# Patient Record
Sex: Female | Born: 1985 | Race: Black or African American | Hispanic: No | Marital: Single | Smoking: Current every day smoker
Health system: Southern US, Community
[De-identification: ages and names within clinical notes are randomized; demographics above are authoritative.]

## PROBLEM LIST (undated history)

## (undated) ENCOUNTER — Inpatient Hospital Stay (HOSPITAL_COMMUNITY): Payer: Self-pay

## (undated) DIAGNOSIS — J45909 Unspecified asthma, uncomplicated: Secondary | ICD-10-CM

## (undated) DIAGNOSIS — D649 Anemia, unspecified: Secondary | ICD-10-CM

## (undated) DIAGNOSIS — O24419 Gestational diabetes mellitus in pregnancy, unspecified control: Secondary | ICD-10-CM

## (undated) HISTORY — PX: DILATION AND CURETTAGE OF UTERUS: SHX78

## (undated) HISTORY — DX: Gestational diabetes mellitus in pregnancy, unspecified control: O24.419

---
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
## 2006-03-16 ENCOUNTER — Emergency Department (HOSPITAL_COMMUNITY): Admission: EM | Admit: 2006-03-16 | Discharge: 2006-03-16 | Payer: Self-pay | Admitting: Emergency Medicine

## 2006-05-26 ENCOUNTER — Emergency Department (HOSPITAL_COMMUNITY): Admission: EM | Admit: 2006-05-26 | Discharge: 2006-05-26 | Payer: Self-pay | Admitting: Family Medicine

## 2006-05-27 ENCOUNTER — Emergency Department (HOSPITAL_COMMUNITY): Admission: EM | Admit: 2006-05-27 | Discharge: 2006-05-27 | Payer: Self-pay | Admitting: Emergency Medicine

## 2007-09-10 ENCOUNTER — Emergency Department (HOSPITAL_COMMUNITY): Admission: EM | Admit: 2007-09-10 | Discharge: 2007-09-10 | Payer: Self-pay | Admitting: Emergency Medicine

## 2008-04-26 ENCOUNTER — Ambulatory Visit: Payer: Self-pay | Admitting: Physician Assistant

## 2008-04-26 ENCOUNTER — Inpatient Hospital Stay (HOSPITAL_COMMUNITY): Admission: AD | Admit: 2008-04-26 | Discharge: 2008-04-26 | Payer: Self-pay | Admitting: Obstetrics

## 2008-07-13 ENCOUNTER — Inpatient Hospital Stay (HOSPITAL_COMMUNITY): Admission: AD | Admit: 2008-07-13 | Discharge: 2008-07-16 | Payer: Self-pay | Admitting: Family Medicine

## 2009-06-17 ENCOUNTER — Emergency Department (HOSPITAL_COMMUNITY): Admission: EM | Admit: 2009-06-17 | Discharge: 2009-06-18 | Payer: Self-pay | Admitting: Emergency Medicine

## 2009-06-18 ENCOUNTER — Inpatient Hospital Stay (HOSPITAL_COMMUNITY): Admission: EM | Admit: 2009-06-18 | Discharge: 2009-06-19 | Payer: Self-pay | Admitting: Psychiatry

## 2009-06-18 ENCOUNTER — Ambulatory Visit: Payer: Self-pay | Admitting: Psychiatry

## 2009-12-13 ENCOUNTER — Emergency Department (HOSPITAL_COMMUNITY): Admission: EM | Admit: 2009-12-13 | Discharge: 2009-12-14 | Payer: Self-pay | Admitting: Emergency Medicine

## 2010-04-01 LAB — COMPREHENSIVE METABOLIC PANEL
ALT: 12 U/L (ref 0–35)
AST: 15 U/L (ref 0–37)
Alkaline Phosphatase: 77 U/L (ref 39–117)
CO2: 21 mEq/L (ref 19–32)
Chloride: 109 mEq/L (ref 96–112)
Creatinine, Ser: 0.79 mg/dL (ref 0.4–1.2)
GFR calc Af Amer: >60 mL/min (ref >60)
GFR calc non Af Amer: >60 mL/min (ref >60)
Potassium: 3.4 mEq/L — ABNORMAL LOW (ref 3.5–5.1)
Sodium: 139 mEq/L (ref 135–145)
Total Bilirubin: 0.6 mg/dL (ref 0.3–1.2)

## 2010-04-01 LAB — URINALYSIS, ROUTINE W REFLEX MICROSCOPIC
Bilirubin Urine: NEGATIVE
Glucose, UA: NEGATIVE mg/dL
Hgb urine dipstick: NEGATIVE
Ketones, ur: NEGATIVE mg/dL
Nitrite: NEGATIVE
Protein, ur: NEGATIVE mg/dL
Specific Gravity, Urine: 1.007 (ref 1.005–1.030)
Urobilinogen, UA: 0.2 mg/dL (ref 0.0–1.0)
pH: 6 (ref 5.0–8.0)

## 2010-04-01 LAB — CBC
HCT: 35.3 % — ABNORMAL LOW (ref 36.0–46.0)
Hemoglobin: 12.1 g/dL (ref 12.0–15.0)
MCH: 30.3 pg (ref 26.0–34.0)
MCHC: 34.3 g/dL (ref 30.0–36.0)
MCV: 88.6 fL (ref 78.0–100.0)
Platelets: 262 10*3/uL (ref 150–400)
RBC: 3.98 MIL/uL (ref 3.87–5.11)
RDW: 13.4 % (ref 11.5–15.5)
WBC: 8 10*3/uL (ref 4.0–10.5)

## 2010-04-01 LAB — LIPASE, BLOOD: Lipase: 19 U/L (ref 11–59)

## 2010-04-01 LAB — DIFFERENTIAL
Basophils Absolute: 0 10*3/uL (ref 0.0–0.1)
Eosinophils Absolute: 0.1 10*3/uL (ref 0.0–0.7)
Eosinophils Relative: 1 % (ref 0–5)

## 2010-04-01 LAB — POCT PREGNANCY, URINE: Preg Test, Ur: NEGATIVE

## 2010-04-07 LAB — DIFFERENTIAL
Basophils Absolute: 0 10*3/uL (ref 0.0–0.1)
Basophils Relative: 0 % (ref 0–1)
Basophils Relative: 0 % (ref 0–1)
Eosinophils Absolute: 0.1 10*3/uL (ref 0.0–0.7)
Lymphocytes Relative: 17 % (ref 12–46)
Lymphs Abs: 1.4 10*3/uL (ref 0.7–4.0)
Monocytes Absolute: 0.1 10*3/uL (ref 0.1–1.0)
Monocytes Absolute: 0.5 10*3/uL (ref 0.1–1.0)
Monocytes Relative: 1 % — ABNORMAL LOW (ref 3–12)
Monocytes Relative: 5 % (ref 3–12)
Neutro Abs: 5.7 10*3/uL (ref 1.7–7.7)
Neutro Abs: 6.6 10*3/uL (ref 1.7–7.7)
Neutrophils Relative %: 81 % — ABNORMAL HIGH (ref 43–77)

## 2010-04-07 LAB — COMPREHENSIVE METABOLIC PANEL
Albumin: 4.3 g/dL (ref 3.5–5.2)
Alkaline Phosphatase: 109 U/L (ref 39–117)
BUN: 7 mg/dL (ref 6–23)
Calcium: 9.6 mg/dL (ref 8.4–10.5)
Creatinine, Ser: 0.77 mg/dL (ref 0.4–1.2)
Glucose, Bld: 76 mg/dL (ref 70–99)
Potassium: 3.3 mEq/L — ABNORMAL LOW (ref 3.5–5.1)
Total Protein: 7.7 g/dL (ref 6.0–8.3)

## 2010-04-07 LAB — RAPID URINE DRUG SCREEN, HOSP PERFORMED
Amphetamines: NOT DETECTED
Barbiturates: NOT DETECTED
Benzodiazepines: NOT DETECTED
Opiates: NOT DETECTED

## 2010-04-07 LAB — CBC
HCT: 39.9 % (ref 36.0–46.0)
Hemoglobin: 12.3 g/dL (ref 12.0–15.0)
Hemoglobin: 13.1 g/dL (ref 12.0–15.0)
MCHC: 32.9 g/dL (ref 30.0–36.0)
MCHC: 34.1 g/dL (ref 30.0–36.0)
MCV: 88.9 fL (ref 78.0–100.0)
MCV: 90.5 fL (ref 78.0–100.0)
Platelets: 228 10*3/uL (ref 150–400)
RDW: 13.5 % (ref 11.5–15.5)
RDW: 13.9 % (ref 11.5–15.5)

## 2010-04-07 LAB — TSH: TSH: 1.83 u[IU]/mL (ref 0.350–4.500)

## 2010-04-07 LAB — URINE MICROSCOPIC-ADD ON

## 2010-04-07 LAB — URINALYSIS, ROUTINE W REFLEX MICROSCOPIC
Glucose, UA: NEGATIVE mg/dL
Hgb urine dipstick: NEGATIVE
Ketones, ur: NEGATIVE mg/dL
Protein, ur: NEGATIVE mg/dL
pH: 6.5 (ref 5.0–8.0)

## 2010-04-07 LAB — HIV ANTIBODY (ROUTINE TESTING W REFLEX): HIV: NONREACTIVE

## 2010-04-07 LAB — RPR: RPR Ser Ql: NONREACTIVE

## 2010-04-07 LAB — ETHANOL: Alcohol, Ethyl (B): <5 mg/dL (ref 0–10)

## 2010-04-28 LAB — CBC
HCT: 25 % — ABNORMAL LOW (ref 36.0–46.0)
Hemoglobin: 11.3 g/dL — ABNORMAL LOW (ref 12.0–15.0)
MCHC: 34.4 g/dL (ref 30.0–36.0)
MCV: 90.9 fL (ref 78.0–100.0)
Platelets: 204 10*3/uL (ref 150–400)
RBC: 2.75 MIL/uL — ABNORMAL LOW (ref 3.87–5.11)
RBC: 3.67 MIL/uL — ABNORMAL LOW (ref 3.87–5.11)
RDW: 14.1 % (ref 11.5–15.5)
WBC: 18.3 10*3/uL — ABNORMAL HIGH (ref 4.0–10.5)

## 2010-04-28 LAB — RPR: RPR Ser Ql: NONREACTIVE

## 2010-04-30 LAB — WET PREP, GENITAL: Trich, Wet Prep: NONE SEEN

## 2010-04-30 LAB — URINALYSIS, ROUTINE W REFLEX MICROSCOPIC
Glucose, UA: NEGATIVE mg/dL
Nitrite: NEGATIVE
Specific Gravity, Urine: <1.005 — ABNORMAL LOW (ref 1.005–1.030)
pH: 7 (ref 5.0–8.0)

## 2010-06-26 ENCOUNTER — Emergency Department (HOSPITAL_COMMUNITY)
Admission: EM | Admit: 2010-06-26 | Discharge: 2010-06-26 | Disposition: A | Discharge status: Home / Self Care | Payer: Self-pay | Attending: Emergency Medicine | Admitting: Emergency Medicine

## 2010-06-26 DIAGNOSIS — K089 Disorder of teeth and supporting structures, unspecified: Secondary | ICD-10-CM | POA: Insufficient documentation

## 2010-07-25 ENCOUNTER — Emergency Department (HOSPITAL_COMMUNITY)
Admission: EM | Admit: 2010-07-25 | Discharge: 2010-07-26 | Disposition: A | Discharge status: Home / Self Care | Payer: Self-pay | Attending: Emergency Medicine | Admitting: Emergency Medicine

## 2010-07-25 DIAGNOSIS — B9689 Other specified bacterial agents as the cause of diseases classified elsewhere: Secondary | ICD-10-CM | POA: Insufficient documentation

## 2010-07-25 DIAGNOSIS — N898 Other specified noninflammatory disorders of vagina: Secondary | ICD-10-CM | POA: Insufficient documentation

## 2010-07-25 DIAGNOSIS — A499 Bacterial infection, unspecified: Secondary | ICD-10-CM | POA: Insufficient documentation

## 2010-07-25 DIAGNOSIS — N760 Acute vaginitis: Secondary | ICD-10-CM | POA: Insufficient documentation

## 2010-07-26 LAB — WET PREP, GENITAL
Trich, Wet Prep: NONE SEEN
Yeast Wet Prep HPF POC: NONE SEEN

## 2010-07-26 LAB — URINALYSIS, ROUTINE W REFLEX MICROSCOPIC
Bilirubin Urine: NEGATIVE
Ketones, ur: NEGATIVE mg/dL
Nitrite: NEGATIVE
Protein, ur: NEGATIVE mg/dL
Urobilinogen, UA: 1 mg/dL (ref 0.0–1.0)

## 2010-07-26 LAB — DIFFERENTIAL
Basophils Relative: 0 % (ref 0–1)
Eosinophils Absolute: 0.2 10*3/uL (ref 0.0–0.7)
Eosinophils Relative: 2 % (ref 0–5)
Lymphs Abs: 2.1 10*3/uL (ref 0.7–4.0)
Monocytes Relative: 6 % (ref 3–12)
Neutrophils Relative %: 58 % (ref 43–77)

## 2010-07-26 LAB — POCT I-STAT, CHEM 8
BUN: 4 mg/dL — ABNORMAL LOW (ref 6–23)
Calcium, Ion: 1.18 mmol/L (ref 1.12–1.32)
Chloride: 109 mEq/L (ref 96–112)
Glucose, Bld: 99 mg/dL (ref 70–99)
HCT: 34 % — ABNORMAL LOW (ref 36.0–46.0)
TCO2: 22 mmol/L (ref 0–100)

## 2010-07-26 LAB — CBC
MCH: 29.8 pg (ref 26.0–34.0)
MCV: 87.7 fL (ref 78.0–100.0)
Platelets: 213 10*3/uL (ref 150–400)
RBC: 3.73 MIL/uL — ABNORMAL LOW (ref 3.87–5.11)
RDW: 13.5 % (ref 11.5–15.5)

## 2010-07-29 LAB — GC/CHLAMYDIA PROBE AMP, GENITAL
Chlamydia, DNA Probe: POSITIVE — AB
GC Probe Amp, Genital: NEGATIVE

## 2013-07-14 ENCOUNTER — Encounter (HOSPITAL_COMMUNITY): Payer: Self-pay | Admitting: Emergency Medicine

## 2013-07-14 ENCOUNTER — Emergency Department (HOSPITAL_COMMUNITY)
Admission: EM | Admit: 2013-07-14 | Discharge: 2013-07-14 | Disposition: A | Discharge status: Home / Self Care | Payer: Self-pay | Attending: Emergency Medicine | Admitting: Emergency Medicine

## 2013-07-14 DIAGNOSIS — N751 Abscess of Bartholin's gland: Secondary | ICD-10-CM | POA: Insufficient documentation

## 2013-07-14 DIAGNOSIS — Z79899 Other long term (current) drug therapy: Secondary | ICD-10-CM | POA: Insufficient documentation

## 2013-07-14 DIAGNOSIS — A499 Bacterial infection, unspecified: Secondary | ICD-10-CM | POA: Insufficient documentation

## 2013-07-14 DIAGNOSIS — B9689 Other specified bacterial agents as the cause of diseases classified elsewhere: Secondary | ICD-10-CM | POA: Insufficient documentation

## 2013-07-14 DIAGNOSIS — Z792 Long term (current) use of antibiotics: Secondary | ICD-10-CM | POA: Insufficient documentation

## 2013-07-14 DIAGNOSIS — Z3202 Encounter for pregnancy test, result negative: Secondary | ICD-10-CM | POA: Insufficient documentation

## 2013-07-14 DIAGNOSIS — F17200 Nicotine dependence, unspecified, uncomplicated: Secondary | ICD-10-CM | POA: Insufficient documentation

## 2013-07-14 DIAGNOSIS — N760 Acute vaginitis: Secondary | ICD-10-CM | POA: Insufficient documentation

## 2013-07-14 LAB — POC URINE PREG, ED: PREG TEST UR: NEGATIVE

## 2013-07-14 LAB — WET PREP, GENITAL
Trich, Wet Prep: NONE SEEN
Yeast Wet Prep HPF POC: NONE SEEN

## 2013-07-14 LAB — RPR

## 2013-07-14 LAB — HIV ANTIBODY (ROUTINE TESTING W REFLEX): HIV: NONREACTIVE

## 2013-07-14 MED ORDER — METRONIDAZOLE 500 MG PO TABS: 500.0000 mg | ORAL_TABLET | Freq: Two times a day (BID) | ORAL | Status: DC

## 2013-07-14 MED ORDER — HYDROCODONE-ACETAMINOPHEN 5-325 MG PO TABS: 2.0000 | ORAL_TABLET | ORAL | Status: DC | PRN

## 2013-07-14 NOTE — Discharge Instructions (Signed)
Keep wound clean and dry. Apply warm compresses to affected area throughout the day, and perform Sitz baths 3 times per day. Take Norco as directed, as needed for pain but do not drive or operate machinery with pain medication use. Followup with [facility] in 2-3 days for wound recheck and further evaluation.  [facility] for emergent changing or worsening symptoms. You also have bacterial vaginosis. Take all of your antibiotic for this, and do not drink alcohol while taking this medication.   Bartholin's Cyst or Abscess Bartholin's glands are small glands located within the folds of skin (labia) along the sides of the lower opening of the vagina (birth canal). A cyst may develop when the duct of the gland becomes blocked. When this happens, fluid that accumulates within the cyst can become infected. This is known as an abscess. The Bartholin gland produces a mucous fluid to lubricate the outside of the vagina during sexual intercourse. SYMPTOMS   Patients with a small cyst may not have any symptoms.  Mild discomfort to severe pain depending on the size of the cyst and if it is infected (abscess).  Pain, redness, and swelling around the lower opening of the vagina.  Painful intercourse.  Pressure in the perineal area.  Swelling of the lips of the vagina (labia).  The cyst or abscess can be on one side or both sides of the vagina. DIAGNOSIS   A large swelling is seen in the lower vagina area by your caregiver.  Painful to touch.  Redness and pain, if it is an abscess. TREATMENT   Sometimes the cyst will go away on its own.  Apply warm wet compresses to the area or take hot sitz baths several times a day.  An incision to drain the cyst or abscess with local anesthesia.  Culture the pus, if it is an abscess.  Antibiotic treatment, if it is an abscess.  Cut open the gland and suture the edges to make the opening of the gland bigger  (marsupialization).  Remove the whole gland if the cyst or abscess returns. PREVENTION   Practice good hygiene.  Clean the vaginal area with a mild soap and soft cloth when bathing.  Do not rub hard in the vaginal area when bathing.  Protect the crotch area with a padded cushion if you take long bike rides or ride horses.  Be sure you are well lubricated when you have sexual intercourse. HOME CARE INSTRUCTIONS   If your cyst or abscess was opened, a small piece of gauze, or a drain, may have been placed in the wound to allow drainage. Do not remove this gauze or drain unless directed by your caregiver.  Wear feminine pads, not tampons, as needed for any drainage or bleeding.  If antibiotics were prescribed, take them exactly as directed. Finish the entire course.  Only take over-the-counter or prescription medicines for pain, discomfort, or fever as directed by your caregiver. SEEK IMMEDIATE MEDICAL CARE IF:   You have an increase in pain, redness, swelling, or drainage.  You have bleeding from the wound which results in the use of more than the number of pads suggested by your caregiver in 24 hours.  You have chills.  You have a fever.  You develop any new problems (symptoms) or aggravation of your existing condition. MAKE SURE YOU:   Understand these instructions.  Will watch your condition.  Will get help right away if you are not doing well or get  worse. Document Released: 01/05/2005 Document Revised: 03/30/2011 Document Reviewed: 08/24/2007 [facility] Patient Information 2015 [place], [place]. This information is not intended to replace advice given to you by your health care provider. Make sure you discuss any questions you have with your health care provider.  Bacterial Vaginosis Bacterial vaginosis is a vaginal infection that occurs when the normal balance of bacteria in the vagina is disrupted. It results from an overgrowth of certain bacteria. This is the most  common vaginal infection in women of childbearing age. Treatment is important to prevent complications, especially in pregnant women, as it can cause a premature delivery. CAUSES  Bacterial vaginosis is caused by an increase in harmful bacteria that are normally present in smaller amounts in the vagina. Several different kinds of bacteria can cause bacterial vaginosis. However, the reason that the condition develops is not fully understood. RISK FACTORS Certain activities or behaviors can put you at an increased risk of developing bacterial vaginosis, including:  Having a new sex partner or multiple sex partners.  Douching.  Using an intrauterine device (IUD) for contraception. Women do not get bacterial vaginosis from toilet seats, bedding, swimming pools, or contact with objects around them. SIGNS AND SYMPTOMS  Some women with bacterial vaginosis have no signs or symptoms. Common symptoms include:  Grey vaginal discharge.  A fishlike odor with discharge, especially after sexual intercourse.  Itching or burning of the vagina and vulva.  Burning or pain with urination. DIAGNOSIS  Your health care provider will take a medical history and examine the vagina for signs of bacterial vaginosis. A sample of vaginal fluid may be taken. Your health care provider will look at this sample under a microscope to check for bacteria and abnormal cells. A vaginal pH test may also be done.  TREATMENT  Bacterial vaginosis may be treated with antibiotic medicines. These may be given in the form of a pill or a vaginal cream. A second round of antibiotics may be prescribed if the condition comes back after treatment.  HOME CARE INSTRUCTIONS   Only take over-the-counter or prescription medicines as directed by your health care provider.  If antibiotic medicine was prescribed, take it as directed. Make sure you finish it even if you start to feel better.  Do not have sex until treatment is  completed.  Tell all sexual partners that you have a vaginal infection. They should see their health care provider and be treated if they have problems, such as a mild rash or itching.  Practice safe sex by using condoms and only having one sex partner. SEEK MEDICAL CARE IF:   Your symptoms are not improving after 3 days of treatment.  You have increased discharge or pain.  You have a fever. MAKE SURE YOU:   Understand these instructions.  Will watch your condition.  Will get help right away if you are not doing well or get worse. FOR MORE INFORMATION  Centers for Disease Control and Prevention, Division of STD Prevention: SolutionApps.co.zawww.cdc.gov/std American Sexual Health Association (ASHA): www.ashastd.org  Document Released: 01/05/2005 Document Revised: 10/26/2012 Document Reviewed: 08/17/2012 [facility] Patient Information 2015 [place], [place]. This information is not intended to replace advice given to you by your health care provider. Make sure you discuss any questions you have with your health care provider.

## 2013-07-14 NOTE — ED Notes (Signed)
Phlebotomy at bedside.

## 2013-07-14 NOTE — ED Notes (Signed)
Reports having pain and swelling to vaginal area. No acute distress noted at triage.

## 2013-07-14 NOTE — ED Provider Notes (Signed)
CSN: [id number]     Arrival date & time 07/14/13  1147 History   First MD Initiated Contact with Patient 07/14/13 1327     Chief Complaint  Patient presents with  . Abscess     (Consider location/radiation/quality/duration/timing/severity/associated sxs/prior Treatment) HPI Comments: Gail Garcia is a 28 y.o. Female with no significant PMHx presents complaining of vaginal/L labial swelling and pain that began this morning at 4am. She states she's unsure if the swelling is internal or external. She wiped this morning and noticed some creamy discharge, unsure if it was coming from her vagina or from the swollen area. Pain is 6/10, sharp, non-radiating, along the L labia, worsened with sitting/pressure on that side, and has not tried anything to relieve the pain. Denies vaginal bleeding, pelvic or abdominal pain, rashes, N/V/D/C, HA, CP, SOB, fevers/chills, myalgias or arthralgias. States that she's had pain in this location for the last few days but had not noticed swelling or d/c until this morning. Currently sexually active with one partner, no protection used. Of note, pt reports that her menses is usually very regular, but that she had two cycles in May, one in the beginning (3rd-9th) and then again on May 29-June 4th. Denies current pregnancy. Smokes 1pack every 3 days, and marijuana occasionally. Drinks infrequently. Denies illicit drug use. No prior abscesses or STDs.   Patient is a 28 y.o. female presenting with abscess. The history is provided by the patient. No language interpreter was used.  Abscess Location:  Ano-genital Ano-genital abscess location:  Vagina Size:  Unsure Abscess quality: draining (unsure if discharge is vaginal or from the swollen area) and painful   Red streaking: no   Duration:  10 hours Progression:  Unchanged Pain details:    Quality:  Sharp   Severity:  Moderate   Duration:  10 hours   Timing:  Constant   Progression:  Unchanged Chronicity:  New Context:  not diabetes, not immunosuppression and not injected drug use   Relieved by:  None tried Exacerbated by: pressure. Ineffective treatments:  None tried Associated symptoms: no anorexia, no fatigue, no fever, no nausea and no vomiting     History reviewed. No pertinent past medical history. History reviewed. No pertinent past surgical history. History reviewed. No pertinent family history. History  Substance Use Topics  . Smoking status: Current Every Day Smoker    Types: Cigarettes  . Smokeless tobacco: Not on file  . Alcohol Use: Yes     Comment: occ   OB History   Grav Para Term Preterm Abortions TAB SAB Ect Mult Living                 Review of Systems  Constitutional: Negative for fever, chills, appetite change and fatigue.  Eyes: Negative for pain and redness.  Respiratory: Negative for cough, chest tightness and shortness of breath.   Gastrointestinal: Negative for nausea, vomiting, abdominal pain, diarrhea, constipation, blood in stool and anorexia.  Genitourinary: Positive for vaginal discharge and genital sores. Negative for dysuria, urgency, frequency, hematuria, flank pain, decreased urine volume, vaginal bleeding, difficulty urinating, vaginal pain, pelvic pain and dyspareunia.  Musculoskeletal: Negative for arthralgias, back pain, joint swelling and myalgias.  Skin: Negative for rash.  Neurological: Negative for dizziness and weakness.  10 Systems reviewed and are negative for acute change except as noted in the HPI.     Allergies  Review of patient's allergies indicates no known allergies.  Home Medications   Prior to Admission medications  Medication Sig Start Date End Date Taking? Authorizing Hezekiah Veltre  Prenatal Vit-Fe Fumarate-FA (PRENATAL MULTIVITAMIN) TABS tablet Take 1 tablet by mouth daily at 12 noon.   Yes Historical Mickel Schreur, MD  HYDROcodone-acetaminophen (NORCO) 5-325 MG per tablet Take 2 tablets by mouth every 4 (four) hours as needed. 07/14/13    Mercedes Strupp Camprubi-Soms, PA-C  metroNIDAZOLE (FLAGYL) 500 MG tablet Take 1 tablet (500 mg total) by mouth 2 (two) times daily. DO NOT DRINK ALCOHOL WHILE TAKING THIS MEDICATION 07/14/13   Donnita FallsMercedes Strupp Camprubi-Soms, PA-C   BP 124/74  Pulse 90  Temp(Src) 98.2 F (36.8 C) (Oral)  Resp 20  Ht 5\' 7"  (1.702 m)  Wt 164 lb 9.6 oz (74.662 kg)  BMI 25.77 kg/m2  SpO2 100%  LMP 06/16/2013 Physical Exam  Nursing note and vitals reviewed. Constitutional: She is oriented to person, place, and time. Vital signs are normal. She appears well-developed and well-nourished. No distress.  HENT:  Head: Normocephalic and atraumatic.  Mouth/Throat: Mucous membranes are normal.  Eyes: Conjunctivae and EOM are normal. Right eye exhibits no discharge. Left eye exhibits no discharge.  Neck: Normal range of motion. Neck supple.  Cardiovascular: Normal rate, regular rhythm, normal heart sounds and intact distal pulses.   No murmur heard. Pulmonary/Chest: Effort normal and breath sounds normal. She has no wheezes. She has no rales.  Abdominal: Soft. Normal appearance and bowel sounds are normal. She exhibits no distension. There is no tenderness. There is no rigidity, no rebound, no guarding, no CVA tenderness and no tenderness at McBurney's point.  Genitourinary: Uterus normal. Pelvic exam was performed with patient supine. There is no rash, tenderness, lesion or injury on the right labia. There is tenderness on the left labia. There is no rash, lesion or injury on the left labia. Cervix exhibits discharge. Right adnexum displays no mass, no tenderness and no fullness. Left adnexum displays no mass, no tenderness and no fullness. No erythema around the vagina. Vaginal discharge found.  Moderate TTP to labia minora at 4'o'clock position with swelling noted, approx 2cm in diameter area of induration, spherical, with no fluctuance or erythema noted. No discharge from the area. No chancres or chancroids noted. No  rashes or vesicles. No adnexal masses, TTP, or fullness. No CMT or cervical friability. White chunky discharge noted within vaginal vault. Uterus mobile, non-TTP, non-deviated, not enlarged.  Musculoskeletal: Normal range of motion.  Neurological: She is alert and oriented to person, place, and time.  Skin: Skin is warm, dry and intact. No lesion noted. No erythema.  No rashes or lesions noted to labia externally. See above for description of indurated area.  Psychiatric: She has a normal mood and affect.    ED Course  Procedures (including critical care time) Labs Review Labs Reviewed  WET PREP, GENITAL - Abnormal; Notable for the following:    Clue Cells Wet Prep HPF POC FEW (*)    WBC, Wet Prep HPF POC FEW (*)    All other components within normal limits  GC/CHLAMYDIA PROBE AMP  RPR  HIV ANTIBODY (ROUTINE TESTING)  POC URINE PREG, ED    Imaging Review No results found.   EKG Interpretation None      MDM   Final diagnoses:  Bartholin's gland abscess  BV (bacterial vaginosis)    Gail Garcia is a 28 y.o. female who presents complaining of vaginal/labial swelling and discharge. No urinary symptoms, no abd pain. Will obtain wet prep, STD panel and Upreg given her uncertainty of STD exposure  or pregnancy. Will perform pelvic exam. DDx includes bartholin's gland abscess/cyst, yeast/BV, STD, chancroid, or superficial labial abscess. I do not believe pt needs U/A given no urinary symptoms or abd pain.  2:34 PM Pelvic demonstrates spherical area of induration approx 2cm in diameter at the area of the bartholin's gland but extending deeper, likely forming abscess. No active drainage, no fluctuance noted. Vaginal discharge noted, Clue cells on wet prep, will rx flagyl. Dr. Denton Lank performed I&D of superficial labia with minimal drainage, probing unable to access deeper abscess pocket. Plan to d/c with f/up with women's outpt clinic for further tx of likely bartholin's gland  cyst/abscess. Warm sitz baths and warm compresses explained to pt. Advised if any changes or worsening occurs, to go to [facility] hospital. I explained the diagnosis and have given explicit precautions to return to the [facility] ER including for any other new or worsening symptoms. The patient understands and accepts the medical plan as it's been dictated and I have answered their questions. Discharge instructions concerning home care and prescriptions have been given. The patient is STABLE and is discharged to home in good condition.   BP 124/74  Pulse 90  Temp(Src) 98.2 F (36.8 C) (Oral)  Resp 20  Ht 5\' 7"  (1.702 m)  Wt 164 lb 9.6 oz (74.662 kg)  BMI 25.77 kg/m2  SpO2 100%  LMP 06/16/2013   Donnita Falls Camprubi-Soms, PA-C 07/14/13 [address] Jud, PA-C 07/14/13 1521

## 2013-07-15 LAB — GC/CHLAMYDIA PROBE AMP
CT PROBE, AMP APTIMA: NEGATIVE
GC Probe RNA: NEGATIVE

## 2013-07-17 ENCOUNTER — Inpatient Hospital Stay (HOSPITAL_COMMUNITY)
Admission: AD | Admit: 2013-07-17 | Discharge: 2013-07-17 | Disposition: A | Discharge status: Home / Self Care | Payer: Self-pay | Source: Ambulatory Visit | Attending: Obstetrics & Gynecology | Admitting: Obstetrics & Gynecology

## 2013-07-17 ENCOUNTER — Encounter (HOSPITAL_COMMUNITY): Payer: Self-pay | Admitting: *Deleted

## 2013-07-17 DIAGNOSIS — N751 Abscess of Bartholin's gland: Secondary | ICD-10-CM

## 2013-07-17 DIAGNOSIS — F17200 Nicotine dependence, unspecified, uncomplicated: Secondary | ICD-10-CM | POA: Insufficient documentation

## 2013-07-17 HISTORY — DX: Unspecified asthma, uncomplicated: J45.909

## 2013-07-17 MED ORDER — SULFAMETHOXAZOLE-TRIMETHOPRIM 800-160 MG PO TABS: 1.0000 | ORAL_TABLET | Freq: Two times a day (BID) | ORAL | Status: AC

## 2013-07-17 NOTE — MAU Note (Signed)
[facility] for a Bartholin cyst and had it I & D but nothing came out. Was instructed to come to MAU for drainage. Patient states the cyst is the same size and painful. Some mucus discharge.

## 2013-07-17 NOTE — ED Provider Notes (Signed)
Medical screening examination/treatment/procedure(s) were conducted as a shared visit with non-physician practitioner(s) and myself.  I personally evaluated the patient during the encounter.  Pt c/o left lower labial swelling/pain. On exam, suspect early Bartholins abscess. I and D'd - residual induration. Pt afeb. No cellulitis.   INCISION AND DRAINAGE Performed by: Suzi RootsSTEINL,KEVIN E Consent: Verbal consent obtained. Risks and benefits: risks, benefits and alternatives were discussed Type: abscess  Body area: left bartholins cyst/abscess  Anesthesia: local infiltration  Incision was made with a scalpel.  Local anesthetic: lidocaine 2% w epinephrine  Anesthetic total: 3 ml  Drainage: purulent  Drainage amount: small  No cavity/area felt amenable to drain or packing   Patient tolerance: Patient tolerated the procedure well with no immediate complications.      Suzi RootsKevin E Steinl, MD 07/17/13 [phone]

## 2013-07-17 NOTE — MAU Provider Note (Signed)
  History     CSN: [id number]  Arrival date and time: 07/17/13 1707   First Gail Garcia Initiated Contact with Patient 07/17/13 1758      Chief Complaint  Patient presents with  . Bartholin's Cyst   HPI Ms. Gail Garcia is a 28 y.o. [phone] who presents to MAU today with complaint of a bartholin's gland abscess. The patient [facility] and [facility]. The patient denies drainage since that visit. She states that it is about the same size and pain is similar to them. She denies any heat or erythema of the area. She denies fever or vaginal discharge today.   OB History   Grav Para Term Preterm Abortions TAB SAB Ect Mult Living   3 1 1  2  2   1       Past Medical History  Diagnosis Date  . Asthma     Past Surgical History  Procedure Laterality Date  . No past surgeries      History reviewed. No pertinent family history.  History  Substance Use Topics  . Smoking status: Current Every Day Smoker    Types: Cigarettes  . Smokeless tobacco: Not on file  . Alcohol Use: Yes     Comment: occ    Allergies: No Known Allergies  No prescriptions prior to admission    Review of Systems  Constitutional: Negative for fever and malaise/fatigue.  Genitourinary: Negative for dysuria, urgency and frequency.       Neg - vaginal discharge   Physical Exam   Blood pressure 122/92, pulse 76, temperature 98.9 F (37.2 C), temperature source Oral, resp. rate 18, height 5\' 7"  (1.702 m), weight 161 lb 12.8 oz (73.392 kg), last menstrual period 06/16/2013, SpO2 100.00%.  Physical Exam  Constitutional: She is oriented to person, place, and time. She appears well-developed and well-nourished. No distress.  HENT:  Head: Normocephalic and atraumatic.  Cardiovascular: Normal rate.   Respiratory: Effort normal.  Genitourinary:     Neurological: She is alert and oriented to person, place, and time.  Skin: Skin is warm and dry. No erythema.    Psychiatric: She has a normal mood and affect.    MAU Course  Procedures None  MDM The area is not appropriate for I&D today.  Patient was given Rx for Flagyl on 07/14/13. The pharmacy filled it for Methocarbamol instead of metronidazole.  I have called and made the pharmacy aware. Patient was instructed to discontinue Robaxin.  Rx for bactrim given.   Assessment and Plan  A: Bartholin's gland abscess  P: Discharge home Rx for Bactrim given to patient Patient advised to discontinue Robaxin Patient advised to continue warm compresses and sitz baths to encourage spontaneous drainage Warning signs for infection or need for I&D were discussed Patient may return to MAU as needed or if her condition were to change or worsen   Freddi StarrJulie N Ethier, PA-C  07/17/2013, 7:12 PM

## 2013-07-17 NOTE — Discharge Instructions (Signed)
Bartholin's Cyst or Abscess °Bartholin's glands are small glands located within the folds of skin (labia) along the sides of the lower opening of the vagina (birth canal). A cyst may develop when the duct of the gland becomes blocked. When this happens, fluid that accumulates within the cyst can become infected. This is known as an abscess. The Bartholin gland produces a mucous fluid to lubricate the outside of the vagina during sexual intercourse. °SYMPTOMS  °· Patients with a small cyst may not have any symptoms. °· Mild discomfort to severe pain depending on the size of the cyst and if it is infected (abscess). °· Pain, redness, and swelling around the lower opening of the vagina. °· Painful intercourse. °· Pressure in the perineal area. °· Swelling of the lips of the vagina (labia). °· The cyst or abscess can be on one side or both sides of the vagina. °DIAGNOSIS  °· A large swelling is seen in the lower vagina area by your caregiver. °· Painful to touch. °· Redness and pain, if it is an abscess. °TREATMENT  °· Sometimes the cyst will go away on its own. °· Apply warm wet compresses to the area or take hot sitz baths several times a day. °· An incision to drain the cyst or abscess with local anesthesia. °· Culture the pus, if it is an abscess. °· Antibiotic treatment, if it is an abscess. °· Cut open the gland and suture the edges to make the opening of the gland bigger (marsupialization). °· Remove the whole gland if the cyst or abscess returns. °PREVENTION  °· Practice good hygiene. °· Clean the vaginal area with a mild soap and soft cloth when bathing. °· Do not rub hard in the vaginal area when bathing. °· Protect the crotch area with a padded cushion if you take long bike rides or ride horses. °· Be sure you are well lubricated when you have sexual intercourse. °HOME CARE INSTRUCTIONS  °· If your cyst or abscess was opened, a small piece of gauze, or a drain, may have been placed in the wound to allow  drainage. Do not remove this gauze or drain unless directed by your caregiver. °· Wear feminine pads, not tampons, as needed for any drainage or bleeding. °· If antibiotics were prescribed, take them exactly as directed. Finish the entire course. °· Only take over-the-counter or prescription medicines for pain, discomfort, or fever as directed by your caregiver. °SEEK IMMEDIATE MEDICAL CARE IF:  °· You have an increase in pain, redness, swelling, or drainage. °· You have bleeding from the wound which results in the use of more than the number of pads suggested by your caregiver in 24 hours. °· You have chills. °· You have a fever. °· You develop any new problems (symptoms) or aggravation of your existing condition. °MAKE SURE YOU:  °· Understand these instructions. °· Will watch your condition. °· Will get help right away if you are not doing well or get worse. °Document Released: 01/05/2005 Document Revised: 03/30/2011 Document Reviewed: 08/24/2007 °ExitCare® Patient Information ©2015 ExitCare, LLC. This information is not intended to replace advice given to you by your health care provider. Make sure you discuss any questions you have with your health care provider. ° °

## 2013-08-09 ENCOUNTER — Encounter: Payer: Self-pay | Admitting: Obstetrics & Gynecology

## 2013-11-20 ENCOUNTER — Encounter (HOSPITAL_COMMUNITY): Payer: Self-pay | Admitting: *Deleted

## 2014-01-19 NOTE — L&D Delivery Note (Signed)
Delivery Note At 8:25 AM a viable female was delivered via  (Presentation: ;  ).  APGAR: , ; weight  .   Placenta status: , .  Cord:  with the following complications: .  Cord pH: not done  Anesthesia:   Episiotomy:   Lacerations:   Suture Repair: 2.0 Est. Blood Loss (mL):    Mom to postpartum.  Baby to Couplet care / Skin to Skin.  Whitney Bingaman A 11/04/2014, 8:32 AM

## 2014-03-12 ENCOUNTER — Inpatient Hospital Stay (HOSPITAL_COMMUNITY): Payer: Self-pay

## 2014-03-12 ENCOUNTER — Encounter (HOSPITAL_COMMUNITY): Payer: Self-pay | Admitting: *Deleted

## 2014-03-12 ENCOUNTER — Inpatient Hospital Stay (HOSPITAL_COMMUNITY)
Admission: AD | Admit: 2014-03-12 | Discharge: 2014-03-12 | Disposition: A | Discharge status: Home / Self Care | Payer: Self-pay | Source: Ambulatory Visit | Attending: Obstetrics & Gynecology | Admitting: Obstetrics & Gynecology

## 2014-03-12 DIAGNOSIS — Z3A01 Less than 8 weeks gestation of pregnancy: Secondary | ICD-10-CM | POA: Insufficient documentation

## 2014-03-12 DIAGNOSIS — A5901 Trichomonal vulvovaginitis: Secondary | ICD-10-CM | POA: Insufficient documentation

## 2014-03-12 DIAGNOSIS — M549 Dorsalgia, unspecified: Secondary | ICD-10-CM | POA: Insufficient documentation

## 2014-03-12 DIAGNOSIS — O98311 Other infections with a predominantly sexual mode of transmission complicating pregnancy, first trimester: Secondary | ICD-10-CM | POA: Insufficient documentation

## 2014-03-12 DIAGNOSIS — O23591 Infection of other part of genital tract in pregnancy, first trimester: Secondary | ICD-10-CM

## 2014-03-12 DIAGNOSIS — O99331 Smoking (tobacco) complicating pregnancy, first trimester: Secondary | ICD-10-CM | POA: Insufficient documentation

## 2014-03-12 DIAGNOSIS — F17210 Nicotine dependence, cigarettes, uncomplicated: Secondary | ICD-10-CM | POA: Insufficient documentation

## 2014-03-12 DIAGNOSIS — R0789 Other chest pain: Secondary | ICD-10-CM | POA: Insufficient documentation

## 2014-03-12 DIAGNOSIS — O26899 Other specified pregnancy related conditions, unspecified trimester: Secondary | ICD-10-CM

## 2014-03-12 DIAGNOSIS — R109 Unspecified abdominal pain: Secondary | ICD-10-CM | POA: Insufficient documentation

## 2014-03-12 LAB — CBC WITH DIFFERENTIAL/PLATELET
BASOS ABS: 0 10*3/uL (ref 0.0–0.1)
Basophils Relative: 0 % (ref 0–1)
Eosinophils Absolute: 0.1 10*3/uL (ref 0.0–0.7)
Eosinophils Relative: 1 % (ref 0–5)
HCT: 30.2 % — ABNORMAL LOW (ref 36.0–46.0)
Hemoglobin: 10.5 g/dL — ABNORMAL LOW (ref 12.0–15.0)
LYMPHS PCT: 30 % (ref 12–46)
Lymphs Abs: 3.3 10*3/uL (ref 0.7–4.0)
MCH: 30.5 pg (ref 26.0–34.0)
MCHC: 34.8 g/dL (ref 30.0–36.0)
MCV: 87.8 fL (ref 78.0–100.0)
MONO ABS: 0.7 10*3/uL (ref 0.1–1.0)
MONOS PCT: 6 % (ref 3–12)
NEUTROS ABS: 7.1 10*3/uL (ref 1.7–7.7)
Neutrophils Relative %: 63 % (ref 43–77)
Platelets: 218 10*3/uL (ref 150–400)
RBC: 3.44 MIL/uL — ABNORMAL LOW (ref 3.87–5.11)
RDW: 13.1 % (ref 11.5–15.5)
WBC: 11.3 10*3/uL — AB (ref 4.0–10.5)

## 2014-03-12 LAB — URINALYSIS, ROUTINE W REFLEX MICROSCOPIC
Bilirubin Urine: NEGATIVE
Glucose, UA: NEGATIVE mg/dL
HGB URINE DIPSTICK: NEGATIVE
Ketones, ur: NEGATIVE mg/dL
LEUKOCYTES UA: NEGATIVE
NITRITE: NEGATIVE
Protein, ur: NEGATIVE mg/dL
Specific Gravity, Urine: >1.03 — ABNORMAL HIGH (ref 1.005–1.030)
Urobilinogen, UA: 1 mg/dL (ref 0.0–1.0)
pH: 6 (ref 5.0–8.0)

## 2014-03-12 LAB — HCG, QUANTITATIVE, PREGNANCY: hCG, Beta Chain, Quant, S: 16949 m[IU]/mL — ABNORMAL HIGH (ref <5)

## 2014-03-12 LAB — WET PREP, GENITAL
CLUE CELLS WET PREP: NONE SEEN
YEAST WET PREP: NONE SEEN

## 2014-03-12 LAB — ABO/RH: ABO/RH(D): O POS

## 2014-03-12 MED ORDER — METRONIDAZOLE 500 MG PO TABS: 2000.0000 mg | ORAL_TABLET | Freq: Once | ORAL | Status: DC

## 2014-03-12 NOTE — MAU Provider Note (Signed)
History     CSN: [id number]  Arrival date and time: 03/12/14 1416   None     Chief Complaint  Patient presents with  . Back Pain  . Abdominal Pain  . Chest Pain   HPI  Gail Garcia is a 29 year old G3P1011 [redacted]w[redacted]d presenting with abdominal pain, back pain and chest pain. The abdominal pain began 1 week ago and is intermittent. It occurs 7-8 times per day and lasts a few hours. It is described as "poking" and "annoying" and located in the lower abdomen. Laying down helps alleviate the pain. No vaginal bleeding or discharge. She reports a concurrent 1 week history of back pain. It is described as a deep cramping of the lumbar region. The pain also comes and goes but not always at the same time as the abdominal pain. Denies weakness or sensory change in the lower extremities, numbness in the groin or incontinence. She also reports a 1 month history of intermittent chest tightness. She says she has to sit forward with her shoulders hunched to alleviate the pain because it is painful to expand her chest. The pain comes and goes. It is not aggravated by activity or eating. She reports that she is SOB with these episodes. Denies cough and wheezing. She reports she did not have any of these symptoms with her other pregnancies.  Patient states she was told after her first child she could not get pregnancy so she has not been using birth control for a long time.  She did not know she was pregnancy.  She states she Father just died of Lung cancer.     OB History    Gravida Para Term Preterm AB TAB SAB Ectopic Multiple Living   Past Medical History  Diagnosis Date  . Asthma     Past Surgical History  Procedure Laterality Date  . No past surgeries      History reviewed. No pertinent family history.  History  Substance Use Topics  . Smoking status: Current Every Day Smoker    Types: Cigarettes  . Smokeless tobacco: Not on file  . Alcohol Use: Yes     Comment: occ  LAST   TIME  DRANK  HAPPY JUICE-  FRIDAY NIGHT    Allergies: No Known Allergies  Prescriptions prior to admission  Medication Sig Dispense Refill Last Dose  . HYDROcodone-acetaminophen (NORCO/VICODIN) 5-325 MG per tablet Take 1 tablet by mouth every 6 (six) hours as needed for moderate pain.   More than a month at Unknown time  . Prenatal Vit-Fe Fumarate-FA (PRENATAL MULTIVITAMIN) TABS tablet Take 1 tablet by mouth daily at 12 noon.   More than a month at Unknown time    Review of Systems  Constitutional: Negative for fever and chills.  Eyes: Negative for blurred vision.  Respiratory: Positive for shortness of breath. Negative for cough and wheezing.   Cardiovascular: Positive for chest pain and palpitations.  Gastrointestinal: Positive for abdominal pain. Negative for heartburn, nausea, vomiting, diarrhea and constipation.  Genitourinary: Negative for dysuria, urgency, frequency and hematuria.       Neg for vaginal discharge or bleeding.  Musculoskeletal: Positive for back pain. Negative for falls.  Neurological: Positive for headaches. Negative for dizziness and loss of consciousness.   Physical Exam   Blood pressure 122/80, pulse 79, temperature 98 F (36.7 C), temperature source Oral, resp. rate 16, height  (1.702 m),  weight 152 lb 9.6 oz (69.219 kg), last menstrual period 02/04/2014, SpO2 100 %.  Physical Exam  Constitutional: She is oriented to person, place, and time. She appears well-developed and well-nourished. No distress.  HENT:  Head: Normocephalic.  Neck: Normal range of motion.  Cardiovascular: Normal rate and regular rhythm.   Respiratory: Effort normal and breath sounds normal. No respiratory distress. She has no wheezes. She has no rales. She exhibits no tenderness.  GI: Soft. There is tenderness in the right lower quadrant and suprapubic area. There is no rigidity, no rebound and no guarding.  Genitourinary: There is no rash, tenderness or lesion on the right  labia. There is no rash, tenderness or lesion on the left labia. Uterus is not enlarged and not tender. Cervix exhibits no motion tenderness, no discharge and no friability. Right adnexum displays no mass and no tenderness. Left adnexum displays no mass and no tenderness. No erythema, tenderness or bleeding in the vagina. Vaginal discharge (white) found.  Neurological: She is alert and oriented to person, place, and time.  Skin: Skin is warm and dry.   Results for orders placed or performed during the hospital encounter of 03/12/14 (from the past 24 hour(s))  Urinalysis, Routine w reflex microscopic     Status: Abnormal   Collection Time: 03/12/14  3:00 PM  Result Value Ref Range   Color, Urine YELLOW YELLOW   APPearance CLEAR CLEAR   Specific Gravity, Urine >1.030 (H) 1.005 - 1.030   pH 6.0 5.0 - 8.0   Glucose, UA NEGATIVE NEGATIVE mg/dL   Hgb urine dipstick NEGATIVE NEGATIVE   Bilirubin Urine NEGATIVE NEGATIVE   Ketones, ur NEGATIVE NEGATIVE mg/dL   Protein, ur NEGATIVE NEGATIVE mg/dL   Urobilinogen, UA 1.0 0.0 - 1.0 mg/dL   Nitrite NEGATIVE NEGATIVE   Leukocytes, UA NEGATIVE NEGATIVE  Wet prep, genital     Status: Abnormal   Collection Time: 03/12/14  4:27 PM  Result Value Ref Range   Yeast Wet Prep HPF POC NONE SEEN NONE SEEN   Trich, Wet Prep FEW (A) NONE SEEN   Clue Cells Wet Prep HPF POC NONE SEEN NONE SEEN   WBC, Wet Prep HPF POC MODERATE (A) NONE SEEN  CBC with Differential/Platelet     Status: Abnormal   Collection Time: 03/12/14  5:00 PM  Result Value Ref Range   WBC 11.3 (H) 4.0 - 10.5 K/uL   RBC 3.44 (L) 3.87 - 5.11 MIL/uL   Hemoglobin 10.5 (L) 12.0 - 15.0 g/dL   HCT 11.930.2 (L) 14.736.0 - 82.946.0 %   MCV 87.8 78.0 - 100.0 fL   MCH 30.5 26.0 - 34.0 pg   MCHC 34.8 30.0 - 36.0 g/dL   RDW 56.213.1 13.011.5 - 86.515.5 %   Platelets 218 150 - 400 K/uL   Neutrophils Relative % 63 43 - 77 %   Neutro Abs 7.1 1.7 - 7.7 K/uL   Lymphocytes Relative 30 12 - 46 %   Lymphs Abs 3.3 0.7 - 4.0 K/uL    Monocytes Relative 6 3 - 12 %   Monocytes Absolute 0.7 0.1 - 1.0 K/uL   Eosinophils Relative 1 0 - 5 %   Eosinophils Absolute 0.1 0.0 - 0.7 K/uL   Basophils Relative 0 0 - 1 %   Basophils Absolute 0.0 0.0 - 0.1 K/uL  ABO/Rh     Status: None (Preliminary result)   Collection Time: 03/12/14  5:00 PM  Result Value Ref Range   ABO/RH(D) O POS  CLINICAL DATA: Intermittent abdominal pain for 1 week. Estimated gestational age by LMP is 5 weeks 1 day. Quantitative beta HCG is pending.  EXAM: OBSTETRIC <14 WK [place] AND TRANSVAGINAL OB US  TECHNIQUE: Both transabdominal and transvaginal ultrasound examinations were performed for complete evaluation of the gestation as well as the maternal uterus, adnexal regions, and pelvic cul-de-sac. Transvaginal technique was performed to assess early pregnancy.  COMPARISON: None.  FINDINGS: Intrauterine gestational sac: A single intrauterine gestational sac is identified. Contours are regular.  Yolk sac: Yolk sac is identified.  Embryo: Fetal pole is not identified.  Cardiac Activity: Not identified.  MSD: 7.1 mm 5 w 2 d [place] EDC: 11/10/2014  Maternal uterus/adnexae: Uterus is mildly retroverted. Small hypoechoic structure demonstrated in the anterior myometrium measuring 1.3 cm maximal diameter. This is likely a small fibroid. A small subchorionic hemorrhage is suggested. The left ovary is not visualized. Right ovary contains the corpus luteal cyst. Mild free fluid demonstrated in the pelvis.  IMPRESSION: Probable early intrauterine gestational sac with yolk sac, but no fetal pole, or cardiac activity yet visualized. Recommend follow-up quantitative B-HCG levels and follow-up US in 14 days to confirm and assess viability. This recommendation follows SRU consensus guidelines: Diagnostic Criteria for Nonviable Pregnancy Early in the First Trimester. Malva Limes Med 2013; 027:2536-64. Corpus luteal  cyst on the right. Small subchorionic hemorrhage. Small uterine fibroid.   Electronically Signed  By: Burman Nieves M.D.  On: 03/12/2014 17:43       MAU Course  Procedures  MDM abd pain in early pregnancy - CBC, quant-pending, ABO, pelvic, wet prep, GC -result pending, [place]  Assessment and Plan  Abdominal and back pain of early pregnancy Probably IUGS at [redacted]w[redacted]d with YS Chest tightness Trichomonal vaginitisi  P:  Encouraged patient to discontinue smoking     May take Tylenol prn pain     Rx for Flagyl 2gms stat, encouraged her to have her partner treated.  Pelvic rest until 1 week after both treated     Begin prenatal care-she plans to see Dr. Gaynell Face.     If chest sxs worsen return for further evaluation Chayim Bialas,EVE M    I have reviewed the student's note, observed her exam.  Her shift ended prior to lab and u/s results.   03/12/2014, 5:59 PM

## 2014-03-12 NOTE — MAU Note (Signed)
PT  SAYS  LOWER ABD  AND BACK  STARTED  HURTING  ON SAT.    SAYS HER CHEST HURTS ON LEFT  SIDE -  STARTED IN JAN.  .   NO BIRTH CONTROL.   LAST SEX-     WED.     DR MARSHALL DEL OTHER BABY- SEEN LAST  AT 6 WEEK CHECKUP  IN 2010

## 2014-03-12 NOTE — MAU Note (Signed)
MAU upt: Positive 

## 2014-03-12 NOTE — MAU Note (Signed)
Lower back & lower abd pain for the past week, tightness in chest for the last month.  Denies bleeding, discharge, vomiting, or diarrhea.

## 2014-03-12 NOTE — Discharge Instructions (Signed)
Abdominal Pain During Pregnancy Abdominal pain is common in pregnancy. Most of the time, it does not cause harm. There are many causes of abdominal pain. Some causes are more serious than others. Some of the causes of abdominal pain in pregnancy are easily diagnosed. Occasionally, the diagnosis takes time to understand. Other times, the cause is not determined. Abdominal pain can be a sign that something is very wrong with the pregnancy, or the pain may have nothing to do with the pregnancy at all. For this reason, always tell your health care provider if you have any abdominal discomfort. HOME CARE INSTRUCTIONS  Monitor your abdominal pain for any changes. The following actions may help to alleviate any discomfort you are experiencing:  Do not have sexual intercourse or put anything in your vagina until your symptoms go away completely.  Get plenty of rest until your pain improves.  Drink clear fluids if you feel nauseous. Avoid solid food as long as you are uncomfortable or nauseous.  Only take over-the-counter or prescription medicine as directed by your health care provider.  Keep all follow-up appointments with your health care provider. SEEK IMMEDIATE MEDICAL CARE IF:  You are bleeding, leaking fluid, or passing tissue from the vagina.  You have increasing pain or cramping.  You have persistent vomiting.  You have painful or bloody urination.  You have a fever.  You notice a decrease in your baby's movements.  You have extreme weakness or feel faint.  You have shortness of breath, with or without abdominal pain.  You develop a severe headache with abdominal pain.  You have abnormal vaginal discharge with abdominal pain.  You have persistent diarrhea.  You have abdominal pain that continues even after rest, or gets worse. MAKE SURE YOU:   Understand these instructions.  Will watch your condition.  Will get help right away if you are not doing well or get  worse. Document Released: 01/05/2005 Document Revised: 10/26/2012 Document Reviewed: 08/04/2012 [facility] Patient Information 2015 [place], [place]. This information is not intended to replace advice given to you by your health care provider. Make sure you discuss any questions you have with your health care provider. Chest Pain (Nonspecific) It is often hard to give a specific diagnosis for the cause of chest pain. There is always a chance that your pain could be related to something serious, such as a heart attack or a blood clot in the lungs. You need to follow up with your health care provider for further evaluation. CAUSES  Heartburn. Pneumonia or bronchitis. Anxiety or stress. Inflammation around your heart (pericarditis) or lung (pleuritis or pleurisy). A blood clot in the lung. A collapsed lung (pneumothorax). It can develop suddenly on its own (spontaneous pneumothorax) or from trauma to the chest. Shingles infection (herpes zoster virus). The chest wall is composed of bones, muscles, and cartilage. Any of these can be the source of the pain. The bones can be bruised by injury. The muscles or cartilage can be strained by coughing or overwork. The cartilage can be affected by inflammation and become sore (costochondritis). DIAGNOSIS  Lab tests or other studies may be needed to find the cause of your pain. Your health care provider may have you take a test called an ambulatory electrocardiogram (ECG). An ECG records your heartbeat patterns over a 24-hour period. You may also have other tests, such as: Transthoracic echocardiogram (TTE). During echocardiography, sound waves are used to evaluate how blood flows through your heart. Transesophageal echocardiogram (TEE). Cardiac monitoring. This  allows your health care provider to monitor your heart rate and rhythm in real time. Holter monitor. This is a portable device that records your heartbeat and can help diagnose heart arrhythmias. It  allows your health care provider to track your heart activity for several days, if needed. Stress tests by exercise or by giving medicine that makes the heart beat faster. TREATMENT  Treatment depends on what may be causing your chest pain. Treatment may include: Acid blockers for heartburn. Anti-inflammatory medicine. Pain medicine for inflammatory conditions. Antibiotics if an infection is present. You may be advised to change lifestyle habits. This includes stopping smoking and avoiding alcohol, caffeine, and chocolate. You may be advised to keep your head raised (elevated) when sleeping. This reduces the chance of acid going backward from your stomach into your esophagus. Most of the time, nonspecific chest pain will improve within 2-3 days with rest and mild pain medicine.  HOME CARE INSTRUCTIONS  If antibiotics were prescribed, take them as directed. Finish them even if you start to feel better. For the next few days, avoid physical activities that bring on chest pain. Continue physical activities as directed. Do not use any tobacco products, including cigarettes, chewing tobacco, or electronic cigarettes. Avoid drinking alcohol. Only take medicine as directed by your health care provider. Follow your health care provider's suggestions for further testing if your chest pain does not go away. Keep any follow-up appointments you made. If you do not go to an appointment, you could develop lasting (chronic) problems with pain. If there is any problem keeping an appointment, call to reschedule. SEEK MEDICAL CARE IF:  Your chest pain does not go away, even after treatment. You have a rash with blisters on your chest. You have a fever. SEEK IMMEDIATE MEDICAL CARE IF:  You have increased chest pain or pain that spreads to your arm, neck, jaw, back, or abdomen. You have shortness of breath. You have an increasing cough, or you cough up blood. You have severe back or abdominal pain. You feel  nauseous or vomit. You have severe weakness. You faint. You have chills. This is an emergency. Do not wait to see if the pain will go away. Get medical help at once. Call your local emergency services (911 in [place] Do not drive yourself to the hospital. MAKE SURE YOU:  Understand these instructions. Will watch your condition. Will get help right away if you are not doing well or get worse. Document Released: 10/15/2004 Document Revised: 01/10/2013 Document Reviewed: 08/11/2007 [facility] Patient Information 2015 [place]. This information is not intended to replace advice given to you by your health care provider. Make sure you discuss any questions you have with your health care provider. Trichomoniasis Trichomoniasis is an infection caused by an organism called Trichomonas. The infection can affect both women and men. In women, the outer female genitalia and the vagina are affected. In men, the penis is mainly affected, but the prostate and other reproductive organs can also be involved. Trichomoniasis is a sexually transmitted infection (STI) and is most often passed to another person through sexual contact.  RISK FACTORS  Having unprotected sexual intercourse.  Having sexual intercourse with an infected partner. SIGNS AND SYMPTOMS  Symptoms of trichomoniasis in women include:  Abnormal gray-green frothy vaginal discharge.  Itching and irritation of the vagina.  Itching and irritation of the area outside the vagina. Symptoms of trichomoniasis in men include:   Penile discharge with or without pain.  Pain during urination. This results  from inflammation of the urethra. DIAGNOSIS  Trichomoniasis may be found during a Pap test or physical exam. Your health care provider may use one of the following methods to help diagnose this infection:  Examining vaginal discharge under a microscope. For men, urethral discharge would be examined.  Testing the pH of the vagina with a test  tape.  Using a vaginal swab test that checks for the Trichomonas organism. A test is available that provides results within a few minutes.  Doing a culture test for the organism. This is not usually needed. TREATMENT   You may be given medicine to fight the infection. Women should inform their health care provider if they could be or are pregnant. Some medicines used to treat the infection should not be taken during pregnancy.  Your health care provider may recommend over-the-counter medicines or creams to decrease itching or irritation.  Your sexual partner will need to be treated if infected. HOME CARE INSTRUCTIONS   Take medicines only as directed by your health care provider.  Take over-the-counter medicine for itching or irritation as directed by your health care provider.  Do not have sexual intercourse while you have the infection.  Women should not douche or wear tampons while they have the infection.  Discuss your infection with your partner. Your partner may have gotten the infection from you, or you may have gotten it from your partner.  Have your sex partner get examined and treated if necessary.  Practice safe, informed, and protected sex.  See your health care provider for other STI testing. SEEK MEDICAL CARE IF:   You still have symptoms after you finish your medicine.  You develop abdominal pain.  You have pain when you urinate.  You have bleeding after sexual intercourse.  You develop a rash.  Your medicine makes you sick or makes you throw up (vomit). MAKE SURE YOU:  Understand these instructions.  Will watch your condition.  Will get help right away if you are not doing well or get worse. Document Released: 07/01/2000 Document Revised: 05/22/2013 Document Reviewed: 10/17/2012 [facility] Patient Information 2015 [place], [place]. This information is not intended to replace advice given to you by your health care provider. Make sure you discuss any  questions you have with your health care provider.

## 2014-03-13 LAB — GC/CHLAMYDIA PROBE AMP (~~LOC~~) NOT AT ARMC
Chlamydia: NEGATIVE
NEISSERIA GONORRHEA: NEGATIVE

## 2014-03-13 LAB — HIV ANTIBODY (ROUTINE TESTING W REFLEX): HIV Screen 4th Generation wRfx: NONREACTIVE

## 2014-05-15 ENCOUNTER — Inpatient Hospital Stay (HOSPITAL_COMMUNITY)
Admission: AD | Admit: 2014-05-15 | Discharge: 2014-05-15 | Disposition: A | Discharge status: Home / Self Care | Payer: Medicaid Other | Source: Ambulatory Visit | Attending: Family Medicine | Admitting: Family Medicine

## 2014-05-15 DIAGNOSIS — L03818 Cellulitis of other sites: Secondary | ICD-10-CM | POA: Diagnosis not present

## 2014-05-15 DIAGNOSIS — Z3A14 14 weeks gestation of pregnancy: Secondary | ICD-10-CM | POA: Diagnosis not present

## 2014-05-15 DIAGNOSIS — L03317 Cellulitis of buttock: Secondary | ICD-10-CM | POA: Diagnosis not present

## 2014-05-15 DIAGNOSIS — F17210 Nicotine dependence, cigarettes, uncomplicated: Secondary | ICD-10-CM | POA: Insufficient documentation

## 2014-05-15 DIAGNOSIS — O9989 Other specified diseases and conditions complicating pregnancy, childbirth and the puerperium: Secondary | ICD-10-CM | POA: Insufficient documentation

## 2014-05-15 DIAGNOSIS — O99332 Smoking (tobacco) complicating pregnancy, second trimester: Secondary | ICD-10-CM | POA: Diagnosis not present

## 2014-05-15 MED ORDER — OXYCODONE-ACETAMINOPHEN 5-325 MG PO TABS: 1.0000 | ORAL_TABLET | Freq: Four times a day (QID) | ORAL | Status: DC | PRN

## 2014-05-15 MED ORDER — CEPHALEXIN 500 MG PO CAPS: 500.0000 mg | ORAL_CAPSULE | Freq: Four times a day (QID) | ORAL | Status: DC

## 2014-05-15 NOTE — Discharge Instructions (Signed)

## 2014-05-15 NOTE — MAU Note (Signed)
Pt reports yesterday she had a knot come up above her rectum. States she thought it was a boil but her mom told her today that it wasn't. States it has grown bigger today and is very painful.

## 2014-05-15 NOTE — MAU Provider Note (Signed)
  History     CSN: [id number]  Arrival date and time: 05/15/14 2125   First Provider Initiated Contact with Patient 05/15/14 2242      No chief complaint on file.  HPI Ms. Gail Garcia M Pixley is a 29 y.o. G3P1011 at 5650w2d who presents to MAU today with complaint of a bump on the buttocks. The patient states that she noted a small area of swelling yesterday and then it became larger and painful today. She also noted a small area of swelling in her right inguinal area today. She denies fever, drainage, abdominal pain or vaginal bleeding.   OB History    Gravida Para Term Preterm AB TAB SAB Ectopic Multiple Living   3 1 1  1  1   1       Past Medical History  Diagnosis Date  . Asthma     Past Surgical History  Procedure Laterality Date  . No past surgeries      No family history on file.  History  Substance Use Topics  . Smoking status: Current Every Day Smoker    Types: Cigarettes  . Smokeless tobacco: Not on file  . Alcohol Use: Yes     Comment: occ  LAST  TIME  DRANK  HAPPY JUICE-  FRIDAY NIGHT    Allergies: No Known Allergies  Prescriptions prior to admission  Medication Sig Dispense Refill Last Dose  . metroNIDAZOLE (FLAGYL) 500 MG tablet Take 4 tablets (2,000 mg total) by mouth once. 4 tablet 0   . Prenatal Vit-Fe Fumarate-FA (PRENATAL MULTIVITAMIN) TABS tablet Take 1 tablet by mouth daily at 12 noon.   More than a month at Unknown time    Review of Systems  Constitutional: Negative for fever and malaise/fatigue.  Gastrointestinal: Negative for nausea, vomiting, abdominal pain, diarrhea and constipation.  Genitourinary: Negative for dysuria, urgency and frequency.   Physical Exam   Blood pressure 125/78, pulse 110, temperature 98 F (36.7 C), temperature source Oral, resp. rate 20, height 5\' 6"  (1.676 m), weight 160 lb 8 oz (72.802 kg), last menstrual period 02/04/2014, SpO2 100 %.  Physical Exam  Nursing note and vitals reviewed. Constitutional: She is  oriented to person, place, and time. She appears well-developed and well-nourished. No distress.  HENT:  Head: Normocephalic and atraumatic.  Cardiovascular: Normal rate.   Respiratory: Effort normal.  GI: Soft.  Lymphadenopathy:       Right: Inguinal adenopathy present.  Neurological: She is alert and oriented to person, place, and time.  Skin: Skin is warm and dry. There is erythema.     Psychiatric: She has a normal mood and affect.    MAU Course  Procedures None  MDM Discussed with patient [facility] and that it is not necessary at this time Discussed use of warm compresses and antibiotics  Assessment and Plan  A: Cellulitis, possible early abscess  P: Discharge home Rx for Keflex and Percocet given Warning signs for worsening condition discussed Patient advised to follow-up with Dr. Gaynell FaceMarshall as scheduled or [facility] or [facility] if symptoms worsen Patient may return to MAU as needed or if her condition were to change or worsen   Marny LowensteinJulie N Alvenia Treese, PA-C  05/15/2014, 10:42 PM

## 2014-05-30 LAB — CYTOLOGY - PAP: PAP SMEAR: NEGATIVE

## 2014-05-31 LAB — OB RESULTS CONSOLE HIV ANTIBODY (ROUTINE TESTING): HIV: NONREACTIVE

## 2014-05-31 LAB — OB RESULTS CONSOLE RUBELLA ANTIBODY, IGM: RUBELLA: IMMUNE

## 2014-05-31 LAB — OB RESULTS CONSOLE ABO/RH: RH Type: POSITIVE

## 2014-05-31 LAB — OB RESULTS CONSOLE RPR: RPR: NONREACTIVE

## 2014-05-31 LAB — OB RESULTS CONSOLE ANTIBODY SCREEN: ANTIBODY SCREEN: NEGATIVE

## 2014-05-31 LAB — OB RESULTS CONSOLE GC/CHLAMYDIA
Chlamydia: NEGATIVE
Gonorrhea: NEGATIVE

## 2014-05-31 LAB — OB RESULTS CONSOLE HEPATITIS B SURFACE ANTIGEN: HEP B S AG: NEGATIVE

## 2014-07-12 ENCOUNTER — Emergency Department (HOSPITAL_COMMUNITY)
Admission: EM | Admit: 2014-07-12 | Discharge: 2014-07-12 | Disposition: A | Discharge status: Home / Self Care | Payer: Medicaid Other | Attending: Emergency Medicine | Admitting: Emergency Medicine

## 2014-07-12 ENCOUNTER — Encounter (HOSPITAL_COMMUNITY): Payer: Self-pay | Admitting: *Deleted

## 2014-07-12 DIAGNOSIS — F17200 Nicotine dependence, unspecified, uncomplicated: Secondary | ICD-10-CM | POA: Insufficient documentation

## 2014-07-12 DIAGNOSIS — Y9289 Other specified places as the place of occurrence of the external cause: Secondary | ICD-10-CM | POA: Diagnosis not present

## 2014-07-12 DIAGNOSIS — K029 Dental caries, unspecified: Secondary | ICD-10-CM | POA: Insufficient documentation

## 2014-07-12 DIAGNOSIS — O99512 Diseases of the respiratory system complicating pregnancy, second trimester: Secondary | ICD-10-CM | POA: Diagnosis not present

## 2014-07-12 DIAGNOSIS — J45909 Unspecified asthma, uncomplicated: Secondary | ICD-10-CM | POA: Insufficient documentation

## 2014-07-12 DIAGNOSIS — O99332 Smoking (tobacco) complicating pregnancy, second trimester: Secondary | ICD-10-CM | POA: Insufficient documentation

## 2014-07-12 DIAGNOSIS — O9989 Other specified diseases and conditions complicating pregnancy, childbirth and the puerperium: Secondary | ICD-10-CM | POA: Insufficient documentation

## 2014-07-12 DIAGNOSIS — Z3A23 23 weeks gestation of pregnancy: Secondary | ICD-10-CM | POA: Insufficient documentation

## 2014-07-12 DIAGNOSIS — K0889 Other specified disorders of teeth and supporting structures: Secondary | ICD-10-CM

## 2014-07-12 DIAGNOSIS — Y9389 Activity, other specified: Secondary | ICD-10-CM | POA: Diagnosis not present

## 2014-07-12 DIAGNOSIS — Z79899 Other long term (current) drug therapy: Secondary | ICD-10-CM | POA: Diagnosis not present

## 2014-07-12 DIAGNOSIS — K088 Other specified disorders of teeth and supporting structures: Secondary | ICD-10-CM | POA: Diagnosis not present

## 2014-07-12 DIAGNOSIS — Y998 Other external cause status: Secondary | ICD-10-CM | POA: Insufficient documentation

## 2014-07-12 DIAGNOSIS — O99612 Diseases of the digestive system complicating pregnancy, second trimester: Secondary | ICD-10-CM | POA: Diagnosis not present

## 2014-07-12 DIAGNOSIS — T50901A Poisoning by unspecified drugs, medicaments and biological substances, accidental (unintentional), initial encounter: Secondary | ICD-10-CM

## 2014-07-12 DIAGNOSIS — T391X1A Poisoning by 4-Aminophenol derivatives, accidental (unintentional), initial encounter: Secondary | ICD-10-CM | POA: Insufficient documentation

## 2014-07-12 LAB — BASIC METABOLIC PANEL
Anion gap: 8 (ref 5–15)
CO2: 19 mmol/L — ABNORMAL LOW (ref 22–32)
Calcium: 8.5 mg/dL — ABNORMAL LOW (ref 8.9–10.3)
Chloride: 108 mmol/L (ref 101–111)
Creatinine, Ser: 0.49 mg/dL (ref 0.44–1.00)
Glucose, Bld: 69 mg/dL (ref 65–99)
POTASSIUM: 3.5 mmol/L (ref 3.5–5.1)
Sodium: 135 mmol/L (ref 135–145)

## 2014-07-12 LAB — ACETAMINOPHEN LEVEL: Acetaminophen (Tylenol), Serum: 19 ug/mL (ref 10–30)

## 2014-07-12 LAB — HEPATIC FUNCTION PANEL
ALT: 11 U/L — ABNORMAL LOW (ref 14–54)
AST: 13 U/L — ABNORMAL LOW (ref 15–41)
Albumin: 3.1 g/dL — ABNORMAL LOW (ref 3.5–5.0)
Alkaline Phosphatase: 75 U/L (ref 38–126)
Total Bilirubin: 0.2 mg/dL — ABNORMAL LOW (ref 0.3–1.2)
Total Protein: 6.6 g/dL (ref 6.5–8.1)

## 2014-07-12 MED ORDER — AMOXICILLIN 500 MG PO CAPS: 500.0000 mg | ORAL_CAPSULE | Freq: Two times a day (BID) | ORAL | Status: DC

## 2014-07-12 MED ORDER — ACETYLCYSTEINE 20 % IN SOLN: 140.0000 mg/kg | Freq: Once | RESPIRATORY_TRACT | Status: DC

## 2014-07-12 MED ORDER — AMOXICILLIN 500 MG PO CAPS
500.0000 mg | ORAL_CAPSULE | Freq: Once | ORAL | Status: AC
  Administered 2014-07-12: 500 mg via ORAL
  Filled 2014-07-12: qty 1

## 2014-07-12 NOTE — ED Provider Notes (Signed)
CSN: [id number]     Arrival date & time 07/12/14  1644 History  This chart was scribed for non-physician practitioner,Felma Pfefferle Biagio Borg, working with Benjiman Core, MD, by Budd Palmer ED Scribe. This patient was seen in room TR06C/TR06C and the patient's care was started at 5:06 PM    Chief Complaint  Patient presents with  . Dental Pain   The history is provided by the patient. No language interpreter was used.   HPI Comments: Gail Garcia is a 29 y.o. female, who is 6 months pregnant, presents to the Emergency Department complaining of constant, throbbing, worsening right upper dental pain onset 1 day ago. She reports exacerbation from cold air and chewing. She has been taking 2 extra strengthTylenol every 6 hours, with no relief beyond 1.5-2 hours after taking them (6-8 packs per day at /packet). She denies sore throat, fever, chills, and facial swelling. She also denies a history of abscesses. She does not have a dentist. She is using diluted peroxide to rinse her mouth. Her OB is Dr Gaynell Face. Her pregnancy has no complications. She denies abdominal pain, vaginal discharge or bleeding. She reports NKDA.  Per chart, pt is [redacted]w[redacted]d pregnant.    Past Medical History  Diagnosis Date  . Asthma    Past Surgical History  Procedure Laterality Date  . No past surgeries     History reviewed. No pertinent family history. History  Substance Use Topics  . Smoking status: Current Every Day Smoker    Types: Cigarettes  . Smokeless tobacco: Not on file  . Alcohol Use: Yes   OB History    Gravida Para Term Preterm AB TAB SAB Ectopic Multiple Living   Review of Systems  Constitutional: Negative for fever and chills.  HENT: Positive for dental problem. Negative for facial swelling, sore throat and trouble swallowing.   Gastrointestinal: Negative for abdominal pain.  Genitourinary: Negative for vaginal bleeding and vaginal discharge.  Skin: Negative for color  change, rash and wound.  Allergic/Immunologic: Positive for immunocompromised state (pregnant).  Psychiatric/Behavioral: Negative for self-injury.    Allergies  Review of patient's allergies indicates no known allergies.  Home Medications   Prior to Admission medications   Medication Sig Start Date End Date Taking? Authorizing Provider  cephALEXin (KEFLEX) 500 MG capsule Take 1 capsule (500 mg total) by mouth 4 (four) times daily. 05/15/14   Marny Lowenstein, PA-C  metroNIDAZOLE (FLAGYL) 500 MG tablet Take 4 tablets (2,000 mg total) by mouth once. 03/12/14   Elta Guadeloupe, NP  oxyCODONE-acetaminophen (PERCOCET/ROXICET) 5-325 MG per tablet Take 1 tablet by mouth every 6 (six) hours as needed for severe pain. 05/15/14   Marny Lowenstein, PA-C  Prenatal Vit-Fe Fumarate-FA (PRENATAL MULTIVITAMIN) TABS tablet Take 1 tablet by mouth daily at 12 noon.    Historical Provider, MD   BP 126/73 mmHg  Pulse 87  Temp(Src) 98.4 F (36.9 C) (Oral)  Resp 16  SpO2 100%  LMP 02/04/2014 Physical Exam  Constitutional: She appears well-developed and well-nourished. No distress.  HENT:  Head: Normocephalic and atraumatic.  Mouth/Throat: Uvula is midline and oropharynx is clear and moist. Mucous membranes are not dry. No uvula swelling. No oropharyngeal exudate, posterior oropharyngeal edema, posterior oropharyngeal erythema or tonsillar abscesses.  R Upper first molar with deep decay, tender to percussion No adjacent edema or erythema of the gingiva No fluctuance No facial swelling  Neck: Trachea normal, normal  range of motion and phonation normal. Neck supple. No tracheal tenderness present. No rigidity. No tracheal deviation, no edema, no erythema and normal range of motion present.  Cardiovascular: Normal rate.   Pulmonary/Chest: Effort normal and breath sounds normal. No stridor.  Abdominal:  gravid  Lymphadenopathy:    She has no cervical adenopathy.  Neurological: She is alert.  Skin: She is not  diaphoretic.  Nursing note and vitals reviewed.   ED Course  Procedures  DIAGNOSTIC STUDIES: Oxygen Saturation is 100% on RA, normal by my interpretation.    COORDINATION OF CARE: 5:16 PM - Discussed plans to possibly order antibiotics. Recommended seeing a dentist. Pt advised of plan for treatment and pt agrees.  Labs Review Labs Reviewed - No data to display  Imaging Review No results found.   EKG Interpretation None       5:28 PM I have called poison control to discuss questions of tylenol overdose in this patient.    MDM   Final diagnoses:  Pain, dental  Dental caries  Accidental medication overdose, initial encounter    Afebrile, nontoxic patient, currently pregnant, with right upper dental pain since yesterday, taking tylenol for pain.  She has accidentally overdosed on tylenol, taking 6-8 grams of tylenol in the past 27 hours.  She is completely asymptomatic from an overdose standpoint.  I doubted she had taken too much for her body weight but was concerned about the fetal exposure.  Discussed pt at length with poison control, labs ordered but were negative.  Pt advised she should avoid tylenol at least for another day and could talk to Dr Gaynell Face (OBGYN) regarding pain control.  From a dental standpoint pt does have a very deep cavity and appears to be in pain, suspect early infection.  No obvious abscess.  No airway concerns.   D/C home with amoxicillin.  Discussed result, findings, treatment, and follow up  with patient.  Pt given return precautions.  Pt verbalizes understanding and agrees with plan.       I personally performed the services described in this documentation, which was scribed in my presence. The recorded information has been reviewed and is accurate.   Trixie Dredge, PA-C 07/12/14 2223  Benjiman Core, MD 07/15/14 [phone]

## 2014-07-12 NOTE — ED Notes (Signed)
Patient reports right upper dental pain. States has been taking tylenol without relief, patient is 6 months pregnant.

## 2014-07-12 NOTE — ED Notes (Signed)
Respiratory notified of need for nebulizer tx.

## 2014-07-12 NOTE — Discharge Instructions (Signed)
Read the information below.  Use the prescribed medication as directed.  Please discuss all new medications with your pharmacist.  You may return to the Emergency Department at any time for worsening condition or any new symptoms that concern you.   Please call the dentist listed above within 48 hours to schedule a close follow up appointment.  If you develop fevers, swelling in your face, difficulty swallowing or breathing, return to the ER immediately for a recheck.    Unfortunately you have accidentally overdosed on Tylenol.  After running tests, we do not believe this use should have harmed you or your baby.  Please avoid tylenol for the rest of the day.  Many medications are not safe for your baby while you are pregnant.  Please speak with Dr Gaynell Face or your pharmacist to discuss other options.  Please take tylenol only as directed on the packaging.  You should never take more than 8 extra strength pills in a 24 hour period.    Dental Caries Dental caries is tooth decay. This decay can cause a hole in teeth (cavity) that can get bigger and deeper over time. HOME CARE  Brush and floss your teeth. Do this at least two times a day.  Use a fluoride toothpaste.  Use a mouth rinse if told by your dentist or doctor.  Eat less sugary and starchy foods. Drink less sugary drinks.  Avoid snacking often on sugary and starchy foods. Avoid sipping often on sugary drinks.  Keep regular checkups and cleanings with your dentist.  Use fluoride supplements if told by your dentist or doctor.  Allow fluoride to be applied to teeth if told by your dentist or doctor. Document Released: 10/15/2007 Document Revised: 05/22/2013 Document Reviewed: 01/08/2012 [facility] Patient Information 2015 [place], [place]. This information is not intended to replace advice given to you by your health care provider. Make sure you discuss any questions you have with your health care provider.  Dental Pain Toothache is pain in  or around a tooth. It may get worse with chewing or with cold or heat.  HOME CARE  Your dentist may use a numbing medicine during treatment. If so, you may need to avoid eating until the medicine wears off. Ask your dentist about this.  Only take medicine as told by your dentist or doctor.  Avoid chewing food near the painful tooth until after all treatment is done. Ask your dentist about this. GET HELP RIGHT AWAY IF:   The problem gets worse or new problems appear.  You have a fever.  There is redness and puffiness (swelling) of the face, jaw, or neck.  You cannot open your mouth.  There is pain in the jaw.  There is very bad pain that is not helped by medicine. MAKE SURE YOU:   Understand these instructions.  Will watch your condition.  Will get help right away if you are not doing well or get worse. Document Released: 06/24/2007 Document Revised: 03/30/2011 Document Reviewed: 06/24/2007 [facility] Patient Information 2015 [place], [place]. This information is not intended to replace advice given to you by your health care provider. Make sure you discuss any questions you have with your health care provider.    Emergency Department Resource Guide 1) Find a Doctor and Pay Out of Pocket Although you won't have to find out who is covered by your insurance plan, it is a good idea to ask around and get recommendations. You will then need to call the office and see if  the doctor you have chosen will accept you as a new patient and what types of options they offer for patients who are self-pay. Some doctors offer discounts or will set up payment plans for their patients who do not have insurance, but you will need to ask so you aren't surprised when you get to your appointment.  2) Contact Your Local Health Department Not all health departments have doctors that can see patients for sick visits, but many do, so it is worth a call to see if yours does. If you don't know where your local  health department is, you can check in your phone book. The CDC also has a tool to help you locate your state's health department, and many state websites also have listings of all of their local health departments.  3) Find a Walk-in Clinic If your illness is not likely to be very severe or complicated, you may want to try a walk in clinic. These are popping up all over the country in pharmacies, drugstores, and shopping centers. They're usually staffed by nurse practitioners or physician assistants that have been trained to treat common illnesses and complaints. They're usually fairly quick and inexpensive. However, if you have serious medical issues or chronic medical problems, these are probably not your best option.  No Primary Care Doctor: - Call Health Connect at  [phone] - they can help you locate a primary care doctor that  accepts your insurance, provides certain services, etc. - Physician Referral Service- [phone]  Chronic Pain Problems: Organization         Address  Phone   Notes  [facility]  [phone] Patients need to be referred by their primary care doctor.   Medication Assistance: Organization         Address  Phone   Notes  [facility] Medication [facility] [address] [place], [place] [address] [phone] --Must be a resident of [facility] -- Must have NO insurance coverage whatsoever (no Medicaid/ Medicare, etc.) -- The pt. MUST have a primary care doctor that directs their care regularly and follows them in the community   [facility]ist  [phone]   [employer]  [phone]    Agencies that provide inexpensive medical care: Organization         Address  Phone   Notes  [facility]  [phone]   [facility]    [phone]   [facility] [address] [place], [place] [address] [phone]   [facility] [address] [place] [place] [phone]   Planned Parenthood    [phone]   [facility]    [phone]   Community Health and [facility]  [address], [place] Phone:  [phone], Fax:  [phone] Hours of Operation:  9 am - 6 pm, M-F.  Also accepts Medicaid/Medicare and self-pay.  [facility]  [address], [place] Phone: [phone], Fax: [phone]. Hours of Operation:  8:30 am - 5:30 pm, M-F.  Also accepts Medicaid and self-pay.  [facility] [place] [address], [place] Phone: [phone]   [facility] [address] Natasha Bence [place], [place] [phone], Ext. [phone] Mondays & Thursdays: 7-9 AM.  First 15 patients are seen on a first come, first serve basis.    Medicaid-accepting [facility] Providers:  Organization         Address  Phone   Notes  [facility] [address], [place] [phone] Also accepts self-pay patients.  [facility] [address] Laurell Josephs [place], [place]  [phone]   [facility] [address], [place] [phone]   [facility] [address] [place] [phone]   Renaye Rakers [address], [place]   [phone] Only accepts [place] patients after they have their name applied to their card.   Self-Pay (no insurance) in [facility]:  Organization         Address  Phone   Notes  Sickle Cell Patients, [facility] [address] [place], [place] [phone]   [facility] [address] [place], [place] [phone]   [facility] [place]  [address], [place] [phone]   [facility]/Dr. Osei-Bonsu  [address] [place] or [address], [place] [phone] Phone number for both [place] and  [place] locations is the same.  Urgent Medical and [facility] [address], [place] [phone]   [facility] [address], [place] or [address] [phone]   [facility] [address], [place] [phone], phone; [phone], fax Sees patients 1st and 3rd Saturday of every month.  Must not qualify for public or private insurance (i.e. Medicaid, Medicare, [place] Health Choice, Veterans' Benefits)  Household income should be no more than 200% of the poverty level The clinic cannot treat you if you are pregnant or think you are pregnant  Sexually transmitted diseases are not treated at the clinic.    Dental Care: Organization         Address  Phone  Notes  [facility] of [facility] [address] [place], [place] [phone] Accepts children up to age 4 who are enrolled in [place] or [place] Health Choice; pregnant women with a Medicaid card; and children who have applied for Medicaid or [place] Health Choice, but were declined, whose parents can pay a reduced fee at time of service.  [facility] Department of [facility]  [address], [place] [phone] Accepts children up to age 75 who are enrolled in [place] or [place] Health Choice; pregnant women with a Medicaid card; and children who have applied for Medicaid or [place] Health Choice, but were declined, whose parents can pay a reduced fee at time of service.  [facility]  [address] [place], [place] [phone] Patients are seen by appointment only. Walk-ins are not accepted. [facility] will see patients 67 years of age and older. Monday - Tuesday (8am-5pm) Most Wednesdays (8:30-5pm) $30 per visit, cash only  [facility]  [address], [facility] [phone] Patients are seen by appointment only. Walk-ins are not accepted.  [facility] will see patients 77 years of age and older. One Wednesday Evening (Monthly: Volunteer Based).  $30 per visit, cash only  [employer]  [phone] for adults; Children under age 2, call Graduate Pediatric Dentistry at [phone]. Children aged 53-14, please call [phone] to request a pediatric application.  Dental services  are provided in all areas of dental care including fillings, crowns and bridges, complete and partial dentures, implants, gum treatment, root canals, and extractions. Preventive care is also provided. Treatment is provided to both adults and children. Patients are selected via a lottery and there is often a waiting list.   [facility] [address], [place]  [phone] [email or web address]   [facility] [address], [place], [place] [phone], Ext. [phone] Second and Fourth Thursday of each month, opens at 6:30 AM; Clinic ends at 9 AM.  Patients are seen on a first-come first-served basis, and a limited number are seen during each clinic.   [facility]  [address], [place], [place] [phone]   Eligibility Requirements You must have lived in [place], [place], or [place] counties for at least the last three months.   You cannot be eligible for state or federal sponsored [place]healthcare insurance, including CIG[employer]Veterans Administration, [place]icaid, or [employer]icare.   You generally cannot be eligible for healthcare insurance through your employer.    How to apply: Eligibility screenings are held every Tuesday and Wednesday afternoon from 1:00 pm until 4:00 pm. You do not need an appointment for the interview!  [facility] [address], [place], [place] [phone]   [facility]  [phone]   [facility]  [phone]   [facility]  [phone]    Behavioral Health Resources in the  Community: Intensive Outpatient Programs Organization         Address  Phone  Notes  [facility] [address], [place], [place] [phone]   [facility] [address], [place], [place] [phone]   [facility] [address], [place], [place]  [phone]   [facility] [address],  [place], [place] [phone] or [phone]   Substance Abuse Resources Organization         Address  Phone  Notes  [facility] Drug Services  [phone]   [facility]  [phone]   [facility]  [phone]   [facility]  [phone]   Residential & Outpatient Substance Abuse Program  [phone]   Psychological Services Organization         Address  Phone  Notes  [facility]  [phone]- [phone]   [facility]  [phone]- [phone]   [facility] [address], [place] [phone] or [phone]    Mobile Crisis Teams Organization         Address  Phone  Notes  [facility], Mobile Crisis Care Unit  [phone]   [facility]  [address] [place], [place] [phone]   Doristine LocksSharon DeEsch [address] [place] [phone]    Self-Help/Support Groups Organization         Address  Phone             Notes  Mental Health Assoc. of  - variety of support groups  336- [phone] Call for more information  Narcotics Anonymous (NA), Caring Services [address], [place]  2 meetings at this location   Statisticianesidential Treatment Programs Organization         Address  Phone  Notes  [facility] [address],    [place]  [phone]   [facility]  [address], [place] [phone], [place], [place] [phone]  [place], [place] [phone] Admissions: 8am-3pm M-F  Incentives Substance [place] [address]    [place], [place] [phone]   [facility] [address] [place], [place], [place]   [facility] [address],  [place], [place]   [facility] - Intensive Outpatient [address], [place], [place]   [facility] [address]  [place], [place] [phone] or [phone]   [facility] (RTS) [address] [place], [place] Accepts Medicaid  [facility] [address]  [place] [phone] Substance Abuse/Addiction Treatment   [facility] Organization         Address  Phone  Notes  [facility]  [phone]   Domenic Schwab, PhD [address] Arlis Porta [place], [place]   [phone] or [phone]   [place], [place] [phone]   [facility] [address], [place], [place] [phone] Insurance/Medicaid/sponsorship through [facility] [address], [address] [place]                                    [place], [place] [phone] [place] [address], [place] [phone]    Dr. Adele Schilder  [phone]   [facility]. 1) [address] [place] 2) [place] 3)  [place] [address], [place] [phone]  [phone]   [place] [phone] or [phone] (After Hours)

## 2014-09-06 ENCOUNTER — Other Ambulatory Visit (HOSPITAL_COMMUNITY): Payer: Self-pay | Admitting: Obstetrics

## 2014-09-06 DIAGNOSIS — Z3A32 32 weeks gestation of pregnancy: Secondary | ICD-10-CM

## 2014-09-06 DIAGNOSIS — Z8759 Personal history of other complications of pregnancy, childbirth and the puerperium: Secondary | ICD-10-CM

## 2014-09-06 DIAGNOSIS — Z3689 Encounter for other specified antenatal screening: Secondary | ICD-10-CM

## 2014-09-19 ENCOUNTER — Other Ambulatory Visit (HOSPITAL_COMMUNITY): Payer: Self-pay | Admitting: Obstetrics

## 2014-09-19 ENCOUNTER — Encounter (HOSPITAL_COMMUNITY): Payer: Self-pay

## 2014-09-19 ENCOUNTER — Ambulatory Visit (HOSPITAL_COMMUNITY)
Admission: RE | Admit: 2014-09-19 | Discharge: 2014-09-19 | Disposition: A | Discharge status: Home / Self Care | Payer: Medicaid Other | Source: Ambulatory Visit | Attending: Obstetrics | Admitting: Obstetrics

## 2014-09-19 DIAGNOSIS — Z3689 Encounter for other specified antenatal screening: Secondary | ICD-10-CM

## 2014-09-19 DIAGNOSIS — O403XX0 Polyhydramnios, third trimester, not applicable or unspecified: Secondary | ICD-10-CM | POA: Diagnosis not present

## 2014-09-19 DIAGNOSIS — Z3A33 33 weeks gestation of pregnancy: Secondary | ICD-10-CM | POA: Diagnosis not present

## 2014-09-19 DIAGNOSIS — Z3A32 32 weeks gestation of pregnancy: Secondary | ICD-10-CM

## 2014-09-19 DIAGNOSIS — Z36 Encounter for antenatal screening of mother: Secondary | ICD-10-CM | POA: Diagnosis present

## 2014-09-19 DIAGNOSIS — Z8759 Personal history of other complications of pregnancy, childbirth and the puerperium: Secondary | ICD-10-CM

## 2014-09-20 ENCOUNTER — Encounter (HOSPITAL_COMMUNITY): Payer: Self-pay | Admitting: Obstetrics

## 2014-09-20 ENCOUNTER — Other Ambulatory Visit (HOSPITAL_COMMUNITY): Payer: Self-pay | Admitting: Obstetrics

## 2014-10-03 LAB — OB RESULTS CONSOLE GC/CHLAMYDIA
Chlamydia: NEGATIVE
Gonorrhea: NEGATIVE

## 2014-10-03 LAB — OB RESULTS CONSOLE GBS: GBS: NEGATIVE

## 2014-11-01 ENCOUNTER — Telehealth (HOSPITAL_COMMUNITY): Payer: Self-pay | Admitting: *Deleted

## 2014-11-01 ENCOUNTER — Encounter (HOSPITAL_COMMUNITY): Payer: Self-pay | Admitting: *Deleted

## 2014-11-01 NOTE — Telephone Encounter (Signed)
Preadmission screen  

## 2014-11-04 ENCOUNTER — Encounter (HOSPITAL_COMMUNITY): Payer: Self-pay | Admitting: Anesthesiology

## 2014-11-04 ENCOUNTER — Encounter (HOSPITAL_COMMUNITY): Payer: Self-pay

## 2014-11-04 ENCOUNTER — Inpatient Hospital Stay (HOSPITAL_COMMUNITY)
Admission: RE | Admit: 2014-11-04 | Discharge: 2014-11-06 | DRG: 774 | Disposition: A | Discharge status: Home / Self Care | Payer: Medicaid Other | Source: Ambulatory Visit | Attending: Obstetrics | Admitting: Obstetrics

## 2014-11-04 DIAGNOSIS — Z3A40 40 weeks gestation of pregnancy: Secondary | ICD-10-CM | POA: Diagnosis not present

## 2014-11-04 DIAGNOSIS — Z3490 Encounter for supervision of normal pregnancy, unspecified, unspecified trimester: Secondary | ICD-10-CM

## 2014-11-04 DIAGNOSIS — O480 Post-term pregnancy: Secondary | ICD-10-CM | POA: Diagnosis present

## 2014-11-04 LAB — CBC
HCT: 29.1 % — ABNORMAL LOW (ref 36.0–46.0)
HEMATOCRIT: 24.4 % — AB (ref 36.0–46.0)
HEMOGLOBIN: 9.9 g/dL — AB (ref 12.0–15.0)
Hemoglobin: 8.3 g/dL — ABNORMAL LOW (ref 12.0–15.0)
MCH: 29.9 pg (ref 26.0–34.0)
MCH: 30.1 pg (ref 26.0–34.0)
MCHC: 34 g/dL (ref 30.0–36.0)
MCHC: 34 g/dL (ref 30.0–36.0)
MCV: 87.9 fL (ref 78.0–100.0)
MCV: 88.4 fL (ref 78.0–100.0)
PLATELETS: 258 10*3/uL (ref 150–400)
PLATELETS: 208 10*3/uL (ref 150–400)
RBC: 3.31 MIL/uL — ABNORMAL LOW (ref 3.87–5.11)
RBC: 2.76 MIL/uL — ABNORMAL LOW (ref 3.87–5.11)
RDW: 14.2 % (ref 11.5–15.5)
RDW: 14.2 % (ref 11.5–15.5)
WBC: 14 10*3/uL — AB (ref 4.0–10.5)
WBC: 13.7 10*3/uL — ABNORMAL HIGH (ref 4.0–10.5)

## 2014-11-04 LAB — RPR: RPR Ser Ql: NONREACTIVE

## 2014-11-04 MED ORDER — MISOPROSTOL 200 MCG PO TABS
800.0000 ug | ORAL_TABLET | Freq: Once | ORAL | Status: AC
  Filled 2014-11-04: qty 4

## 2014-11-04 MED ORDER — OXYTOCIN 40 UNITS IN LACTATED RINGERS INFUSION - SIMPLE MED
1.0000 m[IU]/min | INTRAVENOUS | Status: DC
  Administered 2014-11-04: 2 m[IU]/min via INTRAVENOUS
  Filled 2014-11-04: qty 1000

## 2014-11-04 MED ORDER — PRENATAL MULTIVITAMIN CH
1.0000 | ORAL_TABLET | Freq: Every day | ORAL | Status: DC
  Administered 2014-11-04 – 2014-11-05 (×2): 1 via ORAL
  Filled 2014-11-04 (×2): qty 1

## 2014-11-04 MED ORDER — PHENYLEPHRINE 40 MCG/ML (10ML) SYRINGE FOR IV PUSH (FOR BLOOD PRESSURE SUPPORT)
80.0000 ug | PREFILLED_SYRINGE | INTRAVENOUS | Status: DC | PRN
  Filled 2014-11-04: qty 20

## 2014-11-04 MED ORDER — LIDOCAINE HCL (PF) 1 % IJ SOLN
30.0000 mL | INTRAMUSCULAR | Status: DC | PRN
  Filled 2014-11-04: qty 30

## 2014-11-04 MED ORDER — FENTANYL 2.5 MCG/ML BUPIVACAINE 1/10 % EPIDURAL INFUSION (WH - ANES)
14.0000 mL/h | INTRAMUSCULAR | Status: DC | PRN
  Filled 2014-11-04: qty 125

## 2014-11-04 MED ORDER — OXYCODONE-ACETAMINOPHEN 5-325 MG PO TABS: 1.0000 | ORAL_TABLET | ORAL | Status: DC | PRN

## 2014-11-04 MED ORDER — SIMETHICONE 80 MG PO CHEW
80.0000 mg | CHEWABLE_TABLET | ORAL | Status: DC | PRN
Start: 2014-11-04 — End: 2014-11-06

## 2014-11-04 MED ORDER — OXYTOCIN 40 UNITS IN LACTATED RINGERS INFUSION - SIMPLE MED: 250.0000 mL/h | INTRAVENOUS | Status: DC

## 2014-11-04 MED ORDER — LACTATED RINGERS IV BOLUS (SEPSIS)
1000.0000 mL | Freq: Once | INTRAVENOUS | Status: AC
  Administered 2014-11-04: 1000 mL via INTRAVENOUS

## 2014-11-04 MED ORDER — TERBUTALINE SULFATE 1 MG/ML IJ SOLN: 0.2500 mg | Freq: Once | INTRAMUSCULAR | Status: DC | PRN

## 2014-11-04 MED ORDER — OXYTOCIN 40 UNITS IN LACTATED RINGERS INFUSION - SIMPLE MED
62.5000 mL/h | INTRAVENOUS | Status: DC
  Administered 2014-11-04: 40 [IU] via INTRAVENOUS

## 2014-11-04 MED ORDER — ONDANSETRON HCL 4 MG PO TABS
4.0000 mg | ORAL_TABLET | ORAL | Status: DC | PRN
Start: 2014-11-04 — End: 2014-11-06

## 2014-11-04 MED ORDER — OXYCODONE-ACETAMINOPHEN 5-325 MG PO TABS
2.0000 | ORAL_TABLET | ORAL | Status: DC | PRN
  Administered 2014-11-04: 2 via ORAL
  Filled 2014-11-04: qty 2

## 2014-11-04 MED ORDER — LACTATED RINGERS IV SOLN
INTRAVENOUS | Status: DC
  Administered 2014-11-04: 01:00:00 via INTRAVENOUS

## 2014-11-04 MED ORDER — ZOLPIDEM TARTRATE 5 MG PO TABS: 5.0000 mg | ORAL_TABLET | Freq: Every evening | ORAL | Status: DC | PRN

## 2014-11-04 MED ORDER — METHYLERGONOVINE MALEATE 0.2 MG/ML IJ SOLN
0.2000 mg | Freq: Once | INTRAMUSCULAR | Status: AC
  Administered 2014-11-04: 0.2 mg via INTRAMUSCULAR

## 2014-11-04 MED ORDER — BUTORPHANOL TARTRATE 1 MG/ML IJ SOLN: 1.0000 mg | INTRAMUSCULAR | Status: DC | PRN

## 2014-11-04 MED ORDER — LACTATED RINGERS IV SOLN
INTRAVENOUS | Status: DC
  Administered 2014-11-04: 125 mL/h via INTRAVENOUS

## 2014-11-04 MED ORDER — DIPHENHYDRAMINE HCL 25 MG PO CAPS: 25.0000 mg | ORAL_CAPSULE | Freq: Four times a day (QID) | ORAL | Status: DC | PRN

## 2014-11-04 MED ORDER — EPHEDRINE 5 MG/ML INJ: 10.0000 mg | INTRAVENOUS | Status: DC | PRN

## 2014-11-04 MED ORDER — OXYCODONE-ACETAMINOPHEN 5-325 MG PO TABS: 2.0000 | ORAL_TABLET | ORAL | Status: DC | PRN

## 2014-11-04 MED ORDER — LIDOCAINE HCL 1 % IJ SOLN
INTRAMUSCULAR | Status: AC
  Filled 2014-11-04: qty 20

## 2014-11-04 MED ORDER — IBUPROFEN 600 MG PO TABS
600.0000 mg | ORAL_TABLET | Freq: Four times a day (QID) | ORAL | Status: DC
  Administered 2014-11-04 – 2014-11-06 (×8): 600 mg via ORAL
  Filled 2014-11-04 (×8): qty 1

## 2014-11-04 MED ORDER — CITRIC ACID-SODIUM CITRATE 334-500 MG/5ML PO SOLN: 30.0000 mL | ORAL | Status: DC | PRN

## 2014-11-04 MED ORDER — OXYTOCIN 40 UNITS IN LACTATED RINGERS INFUSION - SIMPLE MED
INTRAVENOUS | Status: AC
  Administered 2014-11-04: 40 [IU] via INTRAVENOUS
  Filled 2014-11-04: qty 1000

## 2014-11-04 MED ORDER — ACETAMINOPHEN 325 MG PO TABS: 650.0000 mg | ORAL_TABLET | ORAL | Status: DC | PRN

## 2014-11-04 MED ORDER — ONDANSETRON HCL 4 MG/2ML IJ SOLN: 4.0000 mg | Freq: Four times a day (QID) | INTRAMUSCULAR | Status: DC | PRN

## 2014-11-04 MED ORDER — SENNOSIDES-DOCUSATE SODIUM 8.6-50 MG PO TABS
2.0000 | ORAL_TABLET | ORAL | Status: DC
  Administered 2014-11-04 – 2014-11-05 (×2): 2 via ORAL
  Filled 2014-11-04 (×2): qty 2

## 2014-11-04 MED ORDER — LANOLIN HYDROUS EX OINT
TOPICAL_OINTMENT | CUTANEOUS | Status: DC | PRN
Start: 2014-11-04 — End: 2014-11-06

## 2014-11-04 MED ORDER — FERROUS SULFATE 325 (65 FE) MG PO TABS
325.0000 mg | ORAL_TABLET | Freq: Two times a day (BID) | ORAL | Status: DC
  Administered 2014-11-04 – 2014-11-06 (×4): 325 mg via ORAL
  Filled 2014-11-04 (×4): qty 1

## 2014-11-04 MED ORDER — CARBOPROST TROMETHAMINE 250 MCG/ML IM SOLN
250.0000 ug | INTRAMUSCULAR | Status: DC | PRN
Start: 2014-11-04 — End: 2014-11-06

## 2014-11-04 MED ORDER — OXYCODONE-ACETAMINOPHEN 5-325 MG PO TABS
1.0000 | ORAL_TABLET | ORAL | Status: DC | PRN
  Administered 2014-11-05: 1 via ORAL
  Filled 2014-11-04: qty 1

## 2014-11-04 MED ORDER — TETANUS-DIPHTH-ACELL PERTUSSIS 5-2.5-18.5 LF-MCG/0.5 IM SUSP: 0.5000 mL | Freq: Once | INTRAMUSCULAR | Status: DC

## 2014-11-04 MED ORDER — MISOPROSTOL 200 MCG PO TABS
1000.0000 ug | ORAL_TABLET | Freq: Once | ORAL | Status: AC
  Administered 2014-11-04: 1000 ug via RECTAL

## 2014-11-04 MED ORDER — FLEET ENEMA 7-19 GM/118ML RE ENEM: 1.0000 | ENEMA | RECTAL | Status: DC | PRN

## 2014-11-04 MED ORDER — DIPHENHYDRAMINE HCL 50 MG/ML IJ SOLN: 12.5000 mg | INTRAMUSCULAR | Status: DC | PRN

## 2014-11-04 MED ORDER — BENZOCAINE-MENTHOL 20-0.5 % EX AERO
1.0000 | INHALATION_SPRAY | CUTANEOUS | Status: DC | PRN
Start: 2014-11-04 — End: 2014-11-06
  Filled 2014-11-04: qty 56

## 2014-11-04 MED ORDER — ONDANSETRON HCL 4 MG/2ML IJ SOLN: 4.0000 mg | INTRAMUSCULAR | Status: DC | PRN

## 2014-11-04 MED ORDER — WITCH HAZEL-GLYCERIN EX PADS
1.0000 | MEDICATED_PAD | CUTANEOUS | Status: DC | PRN
Start: 2014-11-04 — End: 2014-11-06

## 2014-11-04 MED ORDER — LACTATED RINGERS IV SOLN
500.0000 mL | INTRAVENOUS | Status: DC | PRN
  Administered 2014-11-04: 500 mL via INTRAVENOUS

## 2014-11-04 MED ORDER — DIBUCAINE 1 % RE OINT
1.0000 | TOPICAL_OINTMENT | RECTAL | Status: DC | PRN
Start: 2014-11-04 — End: 2014-11-06
  Filled 2014-11-04: qty 28

## 2014-11-04 MED ORDER — OXYTOCIN 10 UNIT/ML IJ SOLN
10.0000 [IU] | Freq: Once | INTRAMUSCULAR | Status: DC | PRN
  Filled 2014-11-04: qty 1

## 2014-11-04 MED ORDER — OXYTOCIN BOLUS FROM INFUSION
500.0000 mL | INTRAVENOUS | Status: DC
Start: 2014-11-04 — End: 2014-11-04

## 2014-11-04 MED ORDER — CARBOPROST TROMETHAMINE 250 MCG/ML IM SOLN: 250.0000 ug | INTRAMUSCULAR | Status: DC | PRN

## 2014-11-04 NOTE — H&P (Signed)
This is Dr. Francoise CeoBernard Pamelyn Bancroft dictating the history and physical on  Gail Garcia  she is a [age] year old gravida 4 para 1021 EDC 11/02/2014 and she was brought in for induction negative GBS she is on low-dose Pitocin her cervix is 3 cm 80% vertex -1 station amniotomy performed and the fluids clear Past medical history negative Past surgical history negative Social history noncontributory System review negative Physical exam well-developed female in labor HEENT negative Lungs clear to P&A Heart regular rhythm no murmurs no gallops Breasts negative Abdomen term Pelvic as described above Extremities negative

## 2014-11-04 NOTE — Progress Notes (Signed)
[facility].  Pt had spontaneous respirations and SAT of 99% on room air. I was excused by house coverage RN.

## 2014-11-04 NOTE — Progress Notes (Addendum)
   11/04/14 1334 11/04/14 1349 11/04/14 1407  Vitals  BP 112/86 mmHg 140/71 mmHg (!) 144/84 mmHg  BP Location --  Right Arm --   Pulse Rate 85 85 97  Oxygen Therapy  SpO2 --  --  100 %  [facility], L&D/MBU RN's & CRNA's present. Assisted in care. Pt stayed in room #104 following treatment. Total time at bedside 10 min.

## 2014-11-04 NOTE — Progress Notes (Signed)
Nurse tech called RN and stated that patient felt light headed going to the bathroom and had questionable heavier bleeding. NT obtained VS which were stable. RN came to assess patient. Patient in bed sitting in a puddle of blood and shaking. Massaged fundus, firm. Massaged large clots and large amount of bleeding out; had secretary call code hemorrhage. Hemorrhage team came quickly as supplies/cart were being obtained while uterus was still being massaged. Methergine and cytotec given, 200cc bolus pit given, then rate changed to 62.5cc/hr. 1000 cc bolus LR given under pressure and another bag LR hung to run at 125 cc/hr. VS monitored. Foley placed. Dr. Gaynell FaceMarshall arrived during code and updated after pt more stable. See following notes. Earl Galasborne, Linda HedgesStefanie MonticelloHudspeth

## 2014-11-04 NOTE — Progress Notes (Signed)
Called Dr. Gaynell FaceMarshall in regards to patient's pain and if patient could get percocet po or something else since she was NPO post PPH. Dr. Gaynell FaceMarshall ordered that patient could have regular diet now and could have percocet. Also notified about patient's blood pressure creeping up. Dr. Gaynell FaceMarshall ordered to call if BP was > or = to 160/110. Notified Dr. Gaynell FaceMarshall of CBC results. Dr. Gaynell FaceMarshall stated we would continue with 0500 CBC in am. Will continue to monitor patient closely. Earl Galasborne, Linda HedgesStefanie TildenHudspeth

## 2014-11-04 NOTE — Progress Notes (Signed)
Faculty Practice OB/GYN Attending Note  Subjective:  Called to evaluate patient with Code PPH; had SVD about 5 hours ago now with profuse bleeding.  Patient is able to communicate; multiple staff members in room responding to Code   Objective:  Blood pressure 112/86, pulse 88, temperature 86 F (30 C), temperature source Oral, resp. rate 18, height 5\' 8"  (1.727 m), weight 191 lb (86.637 kg), last menstrual period 02/04/2014, SpO2 99 %, unknown if currently breastfeeding.   Filed Vitals:   11/04/14 0930 11/04/14 0945 11/04/14 1300 11/04/14 1334  BP: 138/68 110/90 118/61 112/86  Pulse: 87 88    Temp:   98 F (36.7 C) 86 F (30 C)  TempSrc:      Resp:      Height:      Weight:      SpO2:   99%     Gen: NAD HENT: Normocephalic, atraumatic Lungs: Normal respiratory effort Heart: Regular rate noted Abdomen: NT, fundus firm  Cervix: Evacuated multiple clots from lower uterine segment.  EBL  1500 ml Ext: 2+ DTRs, no edema, no cyanosis, negative Homan's sign  Interventions: Bimanual exam Methergine Pitocin Misoprostol  Assessment & Plan:  29 y.o. [id number] s/p SVD with PPH.  Subsided significantly after uterotonic administration.  Dr. Gaynell FaceMarshall arrived in room during misoprostol placement.  He will follow up labs and manage accordingly.  Continue close observation.   Gail CollinsUGONNA  Pernella Ackerley, MD, FACOG Attending Obstetrician & Gynecologist Faculty Practice, [facility] [place]

## 2014-11-05 LAB — CBC
HCT: 17.4 % — ABNORMAL LOW (ref 36.0–46.0)
Hemoglobin: 6 g/dL — CL (ref 12.0–15.0)
MCH: 30.3 pg (ref 26.0–34.0)
MCHC: 34.5 g/dL (ref 30.0–36.0)
MCV: 87.9 fL (ref 78.0–100.0)
Platelets: 187 10*3/uL (ref 150–400)
RBC: 1.98 MIL/uL — ABNORMAL LOW (ref 3.87–5.11)
RDW: 14.3 % (ref 11.5–15.5)
WBC: 15.9 10*3/uL — ABNORMAL HIGH (ref 4.0–10.5)

## 2014-11-05 NOTE — Progress Notes (Signed)
UR chart review completed.  

## 2014-11-05 NOTE — Lactation Note (Signed)
This note was copied from the chart of Gail Holley Raringianza Konicek. Lactation Consultation Note  Patient Name: Gail Garcia UJWJX'BToday's Date: 11/05/2014 Reason for consult: Follow-up assessment Mostly bf mom that is reporting bilateral nipple soreness, no skin break down notedat this time. She will call at next feeding for latch help. She stated that she wants to start formula now because she knows she will not be able to pump enough when she goes back to work. She only has a single hand pump suggested that she call WIC to talk about DEBP options. Went over engorgement treatment/prevention. She is aware of O/P lactation services, pump rental services, and support group.   Maternal Data    Feeding Feeding Type: Breast Fed Nipple Type: Slow - flow Length of feed: 20 min (minutes each side )  LATCH Score/Interventions                      Lactation Tools Discussed/Used     Consult Status Consult Status: Follow-up Date: 11/06/14 Follow-up type: In-patient    Gail Garcia 11/05/2014, 6:11 PM

## 2014-11-05 NOTE — Progress Notes (Signed)
Patient ID: Gail Garcia, female   DOB: 06-11-1985, 29 y.o.   MRN: [id number] Postpartum day one Blood pressure 129/60 respiration 20 pulse 98 afebrile Fundus firm Moderate lochia Legs negative doing well

## 2014-11-05 NOTE — Clinical Social Work Maternal (Signed)
  CLINICAL SOCIAL WORK MATERNAL/CHILD NOTE  Patient Details  Name: Gail Garcia MRN: [id number] Date of Birth: Apr 23, 1985  Date:  11/05/2014  Clinical Social Worker Initiating Note:  Loleta BooksSarah Jasier Garcia MSW, LCSW Date/ Time Initiated:  11/05/14/1430     Child's Name:  Gail Garcia   Legal Guardian:  Gail Raringianza Garcia and Gail Garcia  Need for Interpreter:  None   Date of Referral:  11/04/14     Reason for Referral:  Current Substance Use/Substance Use During Pregnancy-- [facility]   [facility]   Address:  [address] [place], [place] [address]  Phone number:  [phone]   Household Members:  Significant Other, 28 year old daughter, Sibling  Natural Supports (not living in the home):  Immediate Family   Professional Supports: None   Employment: Unemployed   Type of Work:   N/A  Education:    N/A  Architectinancial Resources:  Medicaid   Other Resources:  [employer]IC, Sales executiveood Stamps    Cultural/Religious Considerations Which May Impact Care:  None reported  Strengths:  Ability to meet basic needs , Home prepared for child    Risk Factors/Current Problems:   1)Substance Use: MOB endorsed THC use "early" in the pregnancy. MOB expressed confidence that the infant's urine and meconium will be negative. Infant's UDS is negative and MDS is pending.    Cognitive State:  Able to Concentrate , Alert , Goal Oriented , Linear Thinking    Mood/Affect:  Happy , Bright , Calm , Comfortable    CSW Assessment: CSW received request for consult due to history of THC use early in pregnancy. MOB provided consent for her mother and her daughter to remain in the room during the assessment.  MOB presented in a pleasant mood, and displayed a full range in affect.  MOB expressed happiness secondary to the infant's birth, and shared that she is looking forward to discharging home.  MOB reported comfort caring for her children since she has previous experience working in the childcare  field.  MOB endorsed having the home prepared for the infant, and discussed strong support system. She shared that she was "laid off" during the pregnancy, but confirmed that the FOB is employed and they are able to meet their basic needs.  MOB denied prior mental health history, and denied history of perinatal mood disorders after her daughter was born and during this pregnancy. MOB presented as attentive as CSW provided education on perinatal mood disorders, and she agreed to contact her medical provider if she notes onset of symptoms.   MOB openly reported history of THC use early in the pregnancy. MOB was unable to recall last use, but stated that it was "early".  She denied additional substance use during the pregnancy. MOB denied questions or concerns related to the hospital drug screen policy, and denied concerns about the collection of the infant's urine and meconium.  MOB expressed confidence that the drug screens will be negative.  MOB aware that CPS will be contacted if the infant's drug screens are positive.  MOB denied additional questions, concerns, or needs at this time. She agreed to contact CSW if needs arise during the admission.  CSW Plan/Description:   1)Patient/Family Education: Perinatal mood disorders, hospital drug screen policy  2) CSW to monitor infant's MDS, and will make a CPS report if positive. 3)No Further Intervention Required/No Barriers to Discharge    Kelby FamVenning, Gail Gallien N, LCSW 11/05/2014, 2:44 PM

## 2014-11-05 NOTE — Lactation Note (Signed)
This note was copied from the chart of Gail Holley Raringianza Bazzi. Lactation Consultation Note BF her 29 yr old for almost 2 months. Plans on breast/bottle feeding. Stated she gave a bottle this morning d/t she was feeling weak from surgery. Mom states baby latches well w/o pain but comes off and on frequently and falls asleep. Educated about newborn behavior, STS, cluster feeding, supply and demand, supplementing.Paced bottle-feeding taught. Mom encouraged to feed baby 8-12 times/24 hours and with feeding cues. Reviewed Baby & Me book's Breastfeeding Basics. WH/LC brochure given w/resources, support groups and LC services. Patient Name: Gail Garcia ZOXWR'UToday's Date: 11/05/2014 Reason for consult: Initial assessment   Maternal Data Has patient been taught Hand Expression?: Yes Does the patient have breastfeeding experience prior to this delivery?: Yes  Feeding Feeding Type: Breast Fed Length of feed: 40 min  LATCH Score/Interventions          Comfort (Breast/Nipple): Soft / non-tender     Intervention(s): Breastfeeding basics reviewed;Support Pillows;Position options;Skin to skin     Lactation Tools Discussed/Used     Consult Status Consult Status: Follow-up Date: 11/06/14 Follow-up type: In-patient    Charyl DancerCARVER, Zayna Toste G 11/05/2014, 6:53 AM

## 2014-11-06 NOTE — Discharge Summary (Signed)
Obstetric Discharge Summary Reason for Admission: induction of labor Prenatal Procedures: none Intrapartum Procedures: spontaneous vaginal delivery Postpartum Procedures: none Complications-Operative and Postpartum: none HEMOGLOBIN  Date Value Ref Range Status  11/05/2014 6.0* 12.0 - 15.0 g/dL Final    Comment:    DELTA CHECK NOTED REPEATED TO VERIFY CRITICAL RESULT CALLED TO, READ BACK BY AND VERIFIED WITH: LAWERENCE,K. @0615  ON 11/05/2014 BY BOVELL,A.    HCT  Date Value Ref Range Status  11/05/2014 17.4* 36.0 - 46.0 % Final    Physical Exam:  General: alert Lochia: appropriate Uterine Fundus: firm Incision: healing well DVT Evaluation: No evidence of DVT seen on physical exam.  Discharge Diagnoses: Term Pregnancy-delivered  Discharge Information: Date: 11/06/2014 Activity: pelvic rest Diet: routine Medications: Percocet Condition: stable Instructions: refer to practice specific booklet Discharge to: home Follow-up Information    Follow up In 6 weeks.      Newborn Data: Live born female  Birth Weight: 7 lb 6 oz (3345 g) APGAR: 8, 9  Home with mother.  MARSHALL,BERNARD A 11/06/2014, 7:08 AM

## 2014-11-06 NOTE — Progress Notes (Signed)
Patient ID: Gail Garcia, female   DOB: 02/03/1985, 29 y.o.   MRN: [id number] Postpartum day 2 Blood pressure 110/50 respiration 18 pulse 88 afebrile Patient had postpartum hemorrhage and a hemoglobin post hemorrhage was 6+ she is asymptomatic fundus firm lochia moderate legs negative she would discharge today on iron twice a day

## 2014-11-06 NOTE — Lactation Note (Signed)
This note was copied from the chart of Gail Garcia. Lactation Consultation Note  Mother states she has been giving baby formula because she thinks he is not getting enough breastmilk since her nipples are lipstick shaped after feedings. Provided education about obtaining a deeper latch, supply and demand. Encouraged her to breastfeed before offering formula to establish her milk supply. Reviewed engorgement care and monitoring voids/stools.   Patient Name: Gail Garcia UJWJX'BToday's Date: 11/06/2014     Maternal Data    Feeding Feeding Type: Breast Fed Length of feed: 25 min  LATCH Score/Interventions                      Lactation Tools Discussed/Used     Consult Status      Hardie PulleyBerkelhammer, Ruth Boschen 11/06/2014, 9:13 AM

## 2014-11-06 NOTE — Discharge Instructions (Signed)
Discharge instructions   You can wash your hair  Shower  Eat what you want  Drink what you want  See me in 6 weeks  Your ankles are going to swell more in the next 2 weeks than when pregnant  No sex for 6 weeks   Jonhatan Hearty A, MD 11/06/2014

## 2014-11-07 LAB — TYPE AND SCREEN
ABO/RH(D): O POS
Antibody Screen: NEGATIVE
UNIT DIVISION: 0
Unit division: 0

## 2015-10-28 ENCOUNTER — Encounter (HOSPITAL_COMMUNITY): Payer: Self-pay

## 2015-10-28 ENCOUNTER — Inpatient Hospital Stay (HOSPITAL_COMMUNITY)
Admission: AD | Admit: 2015-10-28 | Discharge: 2015-10-28 | Disposition: A | Discharge status: Home / Self Care | Payer: Medicaid Other | Source: Ambulatory Visit | Attending: Obstetrics & Gynecology | Admitting: Obstetrics & Gynecology

## 2015-10-28 ENCOUNTER — Inpatient Hospital Stay (HOSPITAL_COMMUNITY): Payer: Medicaid Other

## 2015-10-28 DIAGNOSIS — Z3202 Encounter for pregnancy test, result negative: Secondary | ICD-10-CM | POA: Diagnosis not present

## 2015-10-28 DIAGNOSIS — R1031 Right lower quadrant pain: Secondary | ICD-10-CM | POA: Diagnosis not present

## 2015-10-28 DIAGNOSIS — Z87891 Personal history of nicotine dependence: Secondary | ICD-10-CM | POA: Insufficient documentation

## 2015-10-28 DIAGNOSIS — R1032 Left lower quadrant pain: Secondary | ICD-10-CM | POA: Diagnosis not present

## 2015-10-28 DIAGNOSIS — R109 Unspecified abdominal pain: Secondary | ICD-10-CM | POA: Diagnosis present

## 2015-10-28 HISTORY — DX: Anemia, unspecified: D64.9

## 2015-10-28 LAB — URINE MICROSCOPIC-ADD ON: Bacteria, UA: NONE SEEN

## 2015-10-28 LAB — URINALYSIS, ROUTINE W REFLEX MICROSCOPIC
BILIRUBIN URINE: NEGATIVE
Glucose, UA: NEGATIVE mg/dL
KETONES UR: NEGATIVE mg/dL
NITRITE: NEGATIVE
PROTEIN: NEGATIVE mg/dL
Specific Gravity, Urine: 1.015 (ref 1.005–1.030)
pH: 7 (ref 5.0–8.0)

## 2015-10-28 LAB — POCT PREGNANCY, URINE: PREG TEST UR: NEGATIVE

## 2015-10-28 MED ORDER — KETOROLAC TROMETHAMINE 60 MG/2ML IM SOLN
60.0000 mg | INTRAMUSCULAR | Status: AC
  Administered 2015-10-28: 60 mg via INTRAMUSCULAR
  Filled 2015-10-28: qty 2

## 2015-10-28 MED ORDER — IBUPROFEN 800 MG PO TABS: 800.0000 mg | ORAL_TABLET | Freq: Three times a day (TID) | ORAL | 3 refills | Status: DC | PRN

## 2015-10-28 MED ORDER — KETOROLAC TROMETHAMINE 60 MG/2ML IM SOLN: 60.0000 mg | Freq: Once | INTRAMUSCULAR | Status: DC

## 2015-10-28 NOTE — Discharge Instructions (Signed)

## 2015-10-28 NOTE — MAU Provider Note (Signed)
Faculty Practice OB/GYN MAU Attending Note  History    CSN: [id number]  Arrival date and time: 10/28/15 1813   First Provider Initiated Contact with Patient 10/28/15 1906      Chief Complaint  Patient presents with  . Abdominal Pain   Gail Garcia is a 29 y.o. [id number]  who presents to MAU today for evaluation of acute onset of lower abdominal pain radiating to bilateral lower extremities and making it hard to walk. Denies any abnormal vaginal bleeding, discharge, fevers, chills, sweats, dysuria, nausea, vomiting, other GI or GU symptoms or other general symptoms.  Accompanied by 67 year old daughter.   Abdominal Pain  This is a new problem. The current episode started today. The onset quality is sudden. The problem occurs constantly. The most recent episode lasted 6 hours. The problem has been gradually worsening. The pain is located in the LLQ and RLQ. The pain is at a severity of 9/10. The pain is severe. The quality of the pain is sharp, burning and cramping. The abdominal pain radiates to the pelvis (bilateral legs). Pertinent negatives include no diarrhea, dysuria, fever, frequency, headaches, myalgias, nausea or vomiting. The pain is aggravated by certain positions and movement. The pain is relieved by nothing. She has tried nothing for the symptoms.   Obstetric History   G3   P2   T2   P0   A1   L2    SAB0   TAB0   Ectopic0   Multiple0   Live Births2     # Outcome Date GA Lbr Len/2nd Weight Sex Delivery Anes PTL Lv  3 Term 11/04/14 [redacted]w[redacted]d 01:47 / 00:08 7 lb 6 oz (3.345 kg) M Vag-Spont None  LIV     Name: Viner,BOY Vallerie     Apgar1:  8                Apgar5: 9  2 Term 2010   7 lb 1 oz (3.204 kg) F Vag-Spont EPI  LIV  1 SAB               Past Medical History:  Diagnosis Date  . Anemia   . Asthma     Past Surgical History:  Procedure Laterality Date  . NO PAST SURGERIES      Family History  Problem Relation Age of Onset  . Asthma Mother   . Diabetes Mother   . Cancer  Father     lung  . Kidney disease Maternal Uncle   . Alcohol abuse Neg Hx   . Arthritis Neg Hx   . Birth defects Neg Hx   . COPD Neg Hx   . Depression Neg Hx   . Drug abuse Neg Hx   . Early death Neg Hx   . Hearing loss Neg Hx   . Heart disease Neg Hx   . Hyperlipidemia Neg Hx   . Hypertension Neg Hx   . Learning disabilities Neg Hx   . Mental illness Neg Hx   . Mental retardation Neg Hx   . Vision loss Neg Hx   . Miscarriages / Stillbirths Neg Hx   . Stroke Neg Hx   . Varicose Veins Neg Hx     Social History  Substance Use Topics  . Smoking status: Former Smoker    Types: Cigarettes    Quit date: 08/01/2014  . Smokeless tobacco: Never Used  . Alcohol use Yes    Allergies: No Known Allergies  No prescriptions prior to admission.  Review of Systems  Constitutional: Negative for fever.  Gastrointestinal: Positive for abdominal pain. Negative for diarrhea, nausea and vomiting.  Genitourinary: Negative for dysuria and frequency.  Musculoskeletal: Negative for myalgias.  Neurological: Negative for headaches.  All other systems reviewed and are negative.  Physical Exam   Blood pressure 122/59, pulse 113, temperature 99.5 F (37.5 C), temperature source Oral, resp. rate 18, last menstrual period 10/21/2015, unknown if currently breastfeeding.  Physical Exam  Constitutional: She is oriented to person, place, and time. She appears well-developed. No distress.  HENT:  Head: Normocephalic and atraumatic.  Eyes: Conjunctivae and EOM are normal. Pupils are equal, round, and reactive to light.  Neck: Normal range of motion. Neck supple.  Cardiovascular: Normal rate.   Respiratory: Effort normal and breath sounds normal.  GI: Soft. She exhibits no distension and no mass. There is tenderness. There is no rebound and no guarding.  Moderate to severe lower abdominal tenderness on palpation  Genitourinary:  Genitourinary Comments: Deferred  Musculoskeletal: Normal range of  motion. She exhibits no edema, tenderness or deformity.  Neurological: She is alert and oriented to person, place, and time. She exhibits normal muscle tone. Coordination normal.  Skin: Skin is warm and dry.  Psychiatric: She has a normal mood and affect.    MAU Course  Procedures MDM 1914 Toradol 60 mg IM given; pelvic ultrasound ordered. 1941 Patient reports improvement in pain, declined ultrasound as she is unable to find anyone to take care of her daughter.  Labs and Imaging   Results for orders placed or performed during the hospital encounter of 10/28/15 (from the past 24 hour(s))  Urinalysis, Routine w reflex microscopic ([facility])     Status: Abnormal   Collection Time: 10/28/15  6:23 PM  Result Value Ref Range   Color, Urine YELLOW YELLOW   APPearance HAZY (A) CLEAR   Specific Gravity, Urine 1.015 1.005 - 1.030   pH 7.0 5.0 - 8.0   Glucose, UA NEGATIVE NEGATIVE mg/dL   Hgb urine dipstick TRACE (A) NEGATIVE   Bilirubin Urine NEGATIVE NEGATIVE   Ketones, ur NEGATIVE NEGATIVE mg/dL   Protein, ur NEGATIVE NEGATIVE mg/dL   Nitrite NEGATIVE NEGATIVE   Leukocytes, UA LARGE (A) NEGATIVE  Urine microscopic-add on     Status: Abnormal   Collection Time: 10/28/15  6:23 PM  Result Value Ref Range   Squamous Epithelial / LPF 0-5 (A) NONE SEEN   WBC, UA 6-30 0 - 5 WBC/hpf   RBC / HPF 0-5 0 - 5 RBC/hpf   Bacteria, UA NONE SEEN NONE SEEN  Pregnancy, urine POC     Status: None   Collection Time: 10/28/15  6:36 PM  Result Value Ref Range   Preg Test, Ur NEGATIVE NEGATIVE    Assessment and Plan   1. Acute bilateral lower abdominal pain   Improved after Toradol, Ibuprofen prescribed Was told to return to MAU for any pain, bleeding, fevers, nausea, vomiting or other concerns, or if her condition were to change or worsen. Discharged to home in stable condition      Medication List    TAKE these medications   ibuprofen 800 MG tablet Commonly known as:  ADVIL,MOTRIN Take  1 tablet (800 mg total) by mouth 3 (three) times daily with meals as needed for headache or moderate pain.        Jaynie CollinsUGONNA  Christifer Chapdelaine, MD, FACOG Attending Obstetrician & Gynecologist, [facility], [facility]

## 2015-10-28 NOTE — MAU Note (Signed)
Pt was at work today and started feeling pain in her abdomen and legs and almost fell over. Went home to rest and then came to MAU.

## 2017-01-19 NOTE — L&D Delivery Note (Addendum)
Delivery Note At 12:53 PM, on 01/11/2018, a viable female "Gail Garcia" was delivered via Vaginal, Spontaneous (Presentation: Left Occiput Anterior with restitution to LOT).  After delivery of head, nuchal cord noted that shoulders and body was delivered through via somersault maneuver. Infant with good tone and spontaneous cry. Tactile stimulation given by provider and infant placed on mother's abdomen where nurse continued tactile stimulation. Infant APGAR: 9, 9. After delay, cord was clamped and cut, and blood collected. Placenta delivered spontaneously and noted to be intact with 3VC upon inspection.  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 Vaginal inspection revealed no lacerations.  Fundus firm, at the umbilicus, and bleeding small to moderate with notable trickle.  Sweep of LUS results in ~6460mL of clots removed and improvement of bleeding to scant to small.  Mother hemodynamically stable and infant skin to skin prior to provider exit.  Mother unsure of birth control method and opts to breastfeed.  Infant weight at one hour of life: 7lbs 3.2oz, 20in.  Family wishes for infant to be circumcised in outpatient setting.   Anesthesia:  Epidural Episiotomy: None Lacerations: None Suture Repair: N/A Est. Blood Loss (mL):  313  Mom to postpartum.  Baby to Couplet care / Skin to Skin.  Cherre RobinsJessica L Emly MSN, CNM 01/11/2018, 1:35 PM  I attest that I was present for the delivery in its entirety and that I agree with the above documentation.  Luna KitchensKathryn Raybon Conard

## 2017-05-20 ENCOUNTER — Inpatient Hospital Stay (HOSPITAL_COMMUNITY)
Admission: AD | Admit: 2017-05-20 | Discharge: 2017-05-20 | Disposition: A | Discharge status: Home / Self Care | Payer: Medicaid Other | Source: Ambulatory Visit | Attending: Obstetrics & Gynecology | Admitting: Obstetrics & Gynecology

## 2017-05-20 ENCOUNTER — Encounter (HOSPITAL_COMMUNITY): Payer: Self-pay

## 2017-05-20 ENCOUNTER — Inpatient Hospital Stay (HOSPITAL_COMMUNITY): Payer: Medicaid Other

## 2017-05-20 DIAGNOSIS — A599 Trichomoniasis, unspecified: Secondary | ICD-10-CM | POA: Diagnosis present

## 2017-05-20 DIAGNOSIS — Z87891 Personal history of nicotine dependence: Secondary | ICD-10-CM | POA: Insufficient documentation

## 2017-05-20 DIAGNOSIS — Z3A11 11 weeks gestation of pregnancy: Secondary | ICD-10-CM | POA: Diagnosis not present

## 2017-05-20 DIAGNOSIS — A5901 Trichomonal vulvovaginitis: Secondary | ICD-10-CM | POA: Diagnosis not present

## 2017-05-20 DIAGNOSIS — O209 Hemorrhage in early pregnancy, unspecified: Secondary | ICD-10-CM | POA: Diagnosis present

## 2017-05-20 DIAGNOSIS — O98311 Other infections with a predominantly sexual mode of transmission complicating pregnancy, first trimester: Secondary | ICD-10-CM | POA: Insufficient documentation

## 2017-05-20 DIAGNOSIS — Z3490 Encounter for supervision of normal pregnancy, unspecified, unspecified trimester: Secondary | ICD-10-CM

## 2017-05-20 LAB — URINALYSIS, ROUTINE W REFLEX MICROSCOPIC
BILIRUBIN URINE: NEGATIVE
Glucose, UA: NEGATIVE mg/dL
HGB URINE DIPSTICK: NEGATIVE
Ketones, ur: NEGATIVE mg/dL
Nitrite: NEGATIVE
PROTEIN: NEGATIVE mg/dL
SPECIFIC GRAVITY, URINE: 1.02 (ref 1.005–1.030)
pH: 7 (ref 5.0–8.0)

## 2017-05-20 LAB — CBC
HCT: 32.8 % — ABNORMAL LOW (ref 36.0–46.0)
HEMOGLOBIN: 11.1 g/dL — AB (ref 12.0–15.0)
MCH: 29.7 pg (ref 26.0–34.0)
MCHC: 33.8 g/dL (ref 30.0–36.0)
MCV: 87.7 fL (ref 78.0–100.0)
Platelets: 279 10*3/uL (ref 150–400)
RBC: 3.74 MIL/uL — ABNORMAL LOW (ref 3.87–5.11)
RDW: 13.8 % (ref 11.5–15.5)
WBC: 12.1 10*3/uL — AB (ref 4.0–10.5)

## 2017-05-20 LAB — WET PREP, GENITAL
Clue Cells Wet Prep HPF POC: NONE SEEN
Sperm: NONE SEEN
YEAST WET PREP: NONE SEEN

## 2017-05-20 LAB — HCG, QUANTITATIVE, PREGNANCY: HCG, BETA CHAIN, QUANT, S: 2170 m[IU]/mL — AB (ref <5)

## 2017-05-20 LAB — POCT PREGNANCY, URINE: PREG TEST UR: POSITIVE — AB

## 2017-05-20 MED ORDER — PRENATAL COMPLETE 14-0.4 MG PO TABS: 1.0000 | ORAL_TABLET | Freq: Every day | ORAL | 12 refills | Status: DC

## 2017-05-20 MED ORDER — METRONIDAZOLE 500 MG PO TABS
2000.0000 mg | ORAL_TABLET | Freq: Once | ORAL | Status: AC
  Administered 2017-05-20: 2000 mg via ORAL
  Filled 2017-05-20: qty 4

## 2017-05-20 NOTE — MAU Note (Signed)
Pt reports she has had vaginal irregular vaginal bleeidng x 3 months. C/o abd pain and cramping a swell. Has taken several pregnancy testduring this time period.  All where negative.bleeding today but had bleeding the pas t two days.

## 2017-05-20 NOTE — Discharge Instructions (Signed)
Safe Medications in Pregnancy   Acne: Benzoyl Peroxide Salicylic Acid  Backache/Headache: Tylenol: 2 regular strength every 4 hours OR              2 Extra strength every 6 hours  Colds/Coughs/Allergies: Benadryl (alcohol free) 25 mg every 6 hours as needed Breath right strips Claritin Cepacol throat lozenges Chloraseptic throat spray Cold-Eeze- up to three times per day Cough drops, alcohol free Flonase (by prescription only) Guaifenesin Mucinex Robitussin DM (plain only, alcohol free) Saline nasal spray/drops Sudafed (pseudoephedrine) & Actifed ** use only after [redacted] weeks gestation and if you do not have high blood pressure Tylenol Vicks Vaporub Zinc lozenges Zyrtec   Constipation: Colace Ducolax suppositories Fleet enema Glycerin suppositories Metamucil Milk of magnesia Miralax Senokot Smooth move tea  Diarrhea: Kaopectate Imodium A-D  *NO pepto Bismol  Hemorrhoids: Anusol Anusol HC Preparation H Tucks  Indigestion: Tums Maalox Mylanta Zantac  Pepcid  Insomnia: Benadryl (alcohol free)  every 6 hours as needed Tylenol PM Unisom, no Gelcaps  Leg Cramps: Tums MagGel  Nausea/Vomiting:  Bonine Dramamine Emetrol Ginger extract Sea bands Meclizine  Nausea medication to take during pregnancy:  Unisom (doxylamine succinate 25 mg tablets) Take one tablet daily at bedtime. If symptoms are not adequately controlled, the dose can be increased to a maximum recommended dose of two tablets daily (1/2 tablet in the morning, 1/2 tablet mid-afternoon and one at bedtime). Vitamin B6  tablets. Take one tablet twice a day (up to 200 mg per day).  Skin Rashes: Aveeno products Benadryl cream or  every 6 hours as needed Calamine Lotion 1% cortisone cream  Yeast infection: Gyne-lotrimin 7 Monistat 7   **If taking multiple medications, please check labels to avoid duplicating the same active ingredients **take medication as directed on  the label ** Do not exceed 4000 mg of tylenol in 24 hours **Do not take medications that contain aspirin or ibuprofen   Expedited Partner Therapy:  Information Sheet for Patients and Partners               You have been offered expedited partner therapy (EPT). This information sheet contains important information and warnings you need to be aware of, so please read it carefully.   Expedited Partner Therapy (EPT) is the clinical practice of treating the sexual partners of persons who receive chlamydia, gonorrhea, or trichomoniasis diagnoses by providing medications or prescriptions to the patient. Patients then provide partners with these therapies without the health-care provider having examined the partner. In other words, EPT is a convenient, fast and private way for patients to help their sexual partners get treated.   Chlamydia and gonorrhea are bacterial infections you get from having sex with a person who is already infected. Trichomoniasis (or trich) is a very common sexually transmitted infection (STI) that is caused by infection with a protozoan parasite called Trichomonas vaginalis.  Many people with these infections dont know it because they feel fine, but without treatment these infections can cause serious health problems, such as pelvic inflammatory disease, ectopic pregnancy, infertility and increased risk of HIV.   It is important to get treated as soon as possible to protect your health, to avoid spreading these infections to others, and to prevent yourself from becoming re-infected. The good news is these infections can be easily cured with proper antibiotic medicine. The best way to take care of your self is to see a doctor or go to your local health department. If you are not able to see a  doctor or other medical provider, you should take EPT.    Recommended Medication: EPT for Chlamydia:  Azithromycin (Zithromax) 1 gram orally in a single dose EPT for Gonorrhea:  Cefixime  (Suprax) 400 milligrams orally in a single dose PLUS azithromycin (Zithromax) 1 gram orally in a single dose EPT for Trichomoniasis:  Metronidazole (Flagyl) 2 grams orally in a single dose   These medicines are very safe. However, you should not take them if you have ever had an allergic reaction (like a rash) to any of these medicines: azithromycin (Zithromax), erythromycin, clarithromycin (Biaxin), metronidazole (Flagyl), tinidazole (Tindimax). If you are uncertain about whether you have an allergy, call your medical provider or pharmacist before taking this medicine. If you have a serious, long-term illness like kidney, liver or heart disease, colitis or stomach problems, or you are currently taking other prescription medication, talk to your provider before taking this medication.   Women: If you have lower belly pain, pain during sex, vomiting, or a fever, do not take this medicine. Instead, you should see a medical provider to be certain you do not have pelvic inflammatory disease (PID). PID can be serious and lead to infertility, pregnancy problems or chronic pelvic pain.   Pregnant Women: It is very important for you to see a doctor to get pregnancy services and pre-natal care. These antibiotics for EPT are safe for pregnant women, but you still need to see a medical provider as soon as possible. It is also important to note that Doxycycline is an alternative therapy for chlamydia, but it should not be taken by someone who is pregnant.   Men: If you have pain or swelling in the testicles or a fever, do not take this medicine and see a medical provider.     Men who have sex with men (MSM): MSM in [place] continue to experience high rates of syphilis and HIV. Many MSM with gonorrhea or chlamydia could also have syphilis and/or HIV and not know it. If you are a man who has sex with other men, it is very important that you see a medical provider and are tested for HIV and syphilis. EPT is not  recommended for gonorrhea for MSM.  Recommended treatment for gonorrhea for MSM is Rocephin (shot) AND azithromycin due to decreased cure rate.  Please see your medical provider if this is the case.    Along with this information sheet is a prescription for the medicine. If you receive a prescription it will be in your name and will indicate your date of birth, or it will be in the name of Expedited Partner Therapy.   In either case, you can have the prescription filled at a pharmacy. You will be responsible for the cost of the medicine, unless you have prescription drug coverage. In that case, you could provide your name so the pharmacy could bill your health plan.   Take the medication as directed. Some people will have a mild, upset stomach, which does not last long. AVOID alcohol 24 hours after taking metronidazole (Flagyl) to reduce the possibility of a disulfiram-like reaction (severe vomiting and abdominal pain).  After taking the medicine, do not have sex for 7 days. Do not share this medicine or give it to anyone else. It is important to tell everyone you have had sex with in the last 60 days that they need to go and get tested for sexually transmitted infections.   Ways to prevent these and other sexually transmitted infections (STIs):  Abstain from sex. This is the only sure way to avoid getting an STI.   Use barrier methods, such as condoms, consistently and correctly.   Limit the number of sexual partners.   Have regular physical exams, including testing for STIs.   For more information about EPT or other issues pertaining to an STI, please contact your medical provider or the [facility] Department at [phone] or [email or web address]

## 2017-05-20 NOTE — MA[facility] Provider Note (Addendum)
History     CSN: [id number]  Arrival date and time: 05/20/17 1509   First Provider Initiated Contact with Patient 05/20/17 1757      Chief Complaint  Patient presents with  . Vaginal Bleeding  . Abdominal Pain   HPI  Ms.  Gail Garcia is a 32 y.o. year old [id number] female at 11.[redacted] weeks gestation who presents to MAU reporting that she has had irregular VB x 3 months, abd pain and cramping. She reports that her last normal menstrual period was in February. She has taken "several" HPTs since she stopped bleeding in February; all were negative. She has been bleeding off and on since February and none of the times were when she expected to have a period. When she started bleeding today, she decided to come be seen.  Past Medical History:  Diagnosis Date  . Anemia   . Asthma     Past Surgical History:  Procedure Laterality Date  . NO PAST SURGERIES      Family History  Problem Relation Age of Onset  . Asthma Mother   . Diabetes Mother   . Cancer Father        lung  . Kidney disease Maternal Uncle   . Alcohol abuse Neg Hx   . Arthritis Neg Hx   . Birth defects Neg Hx   . COPD Neg Hx   . Depression Neg Hx   . Drug abuse Neg Hx   . Early death Neg Hx   . Hearing loss Neg Hx   . Heart disease Neg Hx   . Hyperlipidemia Neg Hx   . Hypertension Neg Hx   . Learning disabilities Neg Hx   . Mental illness Neg Hx   . Mental retardation Neg Hx   . Vision loss Neg Hx   . Miscarriages / Stillbirths Neg Hx   . Stroke Neg Hx   . Varicose Veins Neg Hx     Social History   Tobacco Use  . Smoking status: Former Smoker    Types: Cigarettes    Last attempt to quit: 08/01/2014    Years since quitting: 2.8  . Smokeless tobacco: Never Used  Substance Use Topics  . Alcohol use: Yes  . Drug use: Yes    Types: Marijuana    Comment: early in pregnancy    Allergies: No Known Allergies  No medications prior to admission.    Review of Systems  Constitutional: Negative.   HENT:  Negative.   Eyes: Negative.   Respiratory: Negative.   Cardiovascular: Negative.   Gastrointestinal: Negative.   Endocrine: Negative.   Genitourinary: Positive for pelvic pain and vaginal bleeding (Irregular VB).  Musculoskeletal: Negative.   Skin: Negative.   Allergic/Immunologic: Negative.   Neurological: Negative.   Hematological: Negative.   Psychiatric/Behavioral: Negative.    Physical Exam   Blood pressure 122/80, pulse 79, temperature 98.8 F (37.1 C), resp. rate 18, height  (1.702 m), weight 184 lb (83.5 kg).  Physical Exam  Nursing note and vitals reviewed. Constitutional: She is oriented to person, place, and time. She appears well-developed and well-nourished.  HENT:  Head: Normocephalic and atraumatic.  Eyes: Pupils are equal, round, and reactive to light.  Neck: Normal range of motion.  Cardiovascular: Normal rate, regular rhythm and normal heart sounds.  Respiratory: Effort normal and breath sounds normal.  GI: Soft. Bowel sounds are normal.  Genitourinary:  Genitourinary Comments: Uterus: non-tender, SE: cervix is smooth, pink, no lesions, small amt of  thick, white vaginal d/c -- WP, GC/CT done, closed/long/firm, no CMT or friability, no adnexal tenderness // NO BLOOD seen on exam  Musculoskeletal: Normal range of motion.  Neurological: She is alert and oriented to person, place, and time.  Skin: Skin is warm and dry.  Psychiatric: She has a normal mood and affect. Her behavior is normal. Judgment and thought content normal.    MAU Course  Procedures  MDM [facility] UPT CBC w/Diff ABO/Rh -- not ordered; O POS result on file HCG Wet Prep GC/CT -- pending HIV -- pending OB < 14 wks [place] with TV Flagyl 2 grams po prior to d/c home  Results for orders placed or performed during the hospital encounter of 05/20/17 (from the past 24 hour(s))  Urinalysis, Routine w reflex microscopic     Status: Abnormal   Collection Time: 05/20/17  4:33 PM  Result Value Ref  Range   Color, Urine YELLOW YELLOW   APPearance CLOUDY (A) CLEAR   Specific Gravity, Urine 1.020 1.005 - 1.030   pH 7.0 5.0 - 8.0   Glucose, UA NEGATIVE NEGATIVE mg/dL   Hgb urine dipstick NEGATIVE NEGATIVE   Bilirubin Urine NEGATIVE NEGATIVE   Ketones, ur NEGATIVE NEGATIVE mg/dL   Protein, ur NEGATIVE NEGATIVE mg/dL   Nitrite NEGATIVE NEGATIVE   Leukocytes, UA TRACE (A) NEGATIVE   RBC / HPF 0-5 0 - 5 RBC/hpf   WBC, UA 11-20 0 - 5 WBC/hpf   Bacteria, UA RARE (A) NONE SEEN   Squamous Epithelial / LPF 0-5 0 - 5   Mucus PRESENT    Amorphous Crystal PRESENT   Pregnancy, urine POC     Status: Abnormal   Collection Time: 05/20/17  4:46 PM  Result Value Ref Range   Preg Test, Ur POSITIVE (A) NEGATIVE  Wet prep, genital     Status: Abnormal   Collection Time: 05/20/17  6:10 PM  Result Value Ref Range   Yeast Wet Prep HPF POC NONE SEEN NONE SEEN   Trich, Wet Prep PRESENT (A) NONE SEEN   Clue Cells Wet Prep HPF POC NONE SEEN NONE SEEN   WBC, Wet Prep HPF POC MODERATE (A) NONE SEEN   Sperm NONE SEEN   CBC     Status: Abnormal   Collection Time: 05/20/17  6:35 PM  Result Value Ref Range   WBC 12.1 (H) 4.0 - 10.5 K/uL   RBC 3.74 (L) 3.87 - 5.11 MIL/uL   Hemoglobin 11.1 (L) 12.0 - 15.0 g/dL   HCT 16.1 (L) 09.6 - 04.5 %   MCV 87.7 78.0 - 100.0 fL   MCH 29.7 26.0 - 34.0 pg   MCHC 33.8 30.0 - 36.0 g/dL   RDW 40.9 81.1 - 91.4 %   Platelets 279 150 - 400 K/uL  hCG, quantitative, pregnancy     Status: Abnormal   Collection Time: 05/20/17  6:35 PM  Result Value Ref Range   hCG, Beta Chain, Quant, S 2,170 (H) <5 mIU/mL    US Ob Less Than 14 Weeks With Ob Transvaginal  Result Date: 05/20/2017 CLINICAL DATA:  Pregnant, bleeding, cramping EXAM: OBSTETRIC <14 WK [place] AND TRANSVAGINAL OB US TECHNIQUE: Both transabdominal and transvaginal ultrasound examinations were performed for complete evaluation of the gestation as well as the maternal uterus, adnexal regions, and pelvic cul-de-sac.  Transvaginal technique was performed to assess early pregnancy. COMPARISON:  None. FINDINGS: Intrauterine gestational sac: Single Yolk sac:  Visualized. Embryo:  Not Visualized. MSD: 4.6 mm   5 w  1 d Subchorionic hemorrhage:  None visualized. Maternal uterus/adnexae: Bilateral ovaries are within normal limits, noting a right corpus luteal cyst. Moderate pelvic and right adnexal fluid, simple. IMPRESSION: Single intrauterine gestational sac with yolk sac, measuring 5 weeks 1 day by mean sac diameter. No definite fetal pole is visualized. Consider follow-up pelvic ultrasound in 14 days to confirm viability, as clinically warranted. Electronically Signed   By: Charline Bills M.D.   On: 05/20/2017 19:50     Assessment and Plan  Early stage of pregnancy  - Establish PNC with [facility], as desired - Information given to contact office to call in 4 wks for NOB appt - 1st trimester pregnancy, STD Trich VB in pregnancy info given - Rx for PNV sent  Trichomoniasis - Treated prior to d/c home - Rx provided for partner given to patient  - Discharge home - Patient verbalized an understanding of the plan of care and agrees.    Raelyn Mora MSN, CNM 05/20/2017, 6:08 PM

## 2017-05-21 LAB — GC/CHLAMYDIA PROBE AMP (~~LOC~~) NOT AT ARMC
Chlamydia: NEGATIVE
NEISSERIA GONORRHEA: NEGATIVE

## 2017-05-21 LAB — HIV ANTIBODY (ROUTINE TESTING W REFLEX): HIV SCREEN 4TH GENERATION: NONREACTIVE

## 2017-07-13 ENCOUNTER — Encounter: Payer: Self-pay | Admitting: Medical

## 2017-07-14 ENCOUNTER — Ambulatory Visit (INDEPENDENT_AMBULATORY_CARE_PROVIDER_SITE_OTHER): Payer: Medicaid Other | Admitting: Medical

## 2017-07-14 ENCOUNTER — Other Ambulatory Visit (HOSPITAL_COMMUNITY)
Admission: RE | Admit: 2017-07-14 | Discharge: 2017-07-14 | Disposition: A | Discharge status: Home / Self Care | Payer: Medicaid Other | Source: Ambulatory Visit | Attending: Medical | Admitting: Medical

## 2017-07-14 ENCOUNTER — Encounter: Payer: Self-pay | Admitting: Medical

## 2017-07-14 VITALS — BP 136/73 | HR 93 | Wt 189.1 lb

## 2017-07-14 DIAGNOSIS — O23599 Infection of other part of genital tract in pregnancy, unspecified trimester: Secondary | ICD-10-CM | POA: Diagnosis not present

## 2017-07-14 DIAGNOSIS — A5403 Gonococcal cervicitis, unspecified: Secondary | ICD-10-CM | POA: Insufficient documentation

## 2017-07-14 DIAGNOSIS — Z3480 Encounter for supervision of other normal pregnancy, unspecified trimester: Secondary | ICD-10-CM | POA: Diagnosis present

## 2017-07-14 DIAGNOSIS — O99512 Diseases of the respiratory system complicating pregnancy, second trimester: Secondary | ICD-10-CM

## 2017-07-14 DIAGNOSIS — Z3A00 Weeks of gestation of pregnancy not specified: Secondary | ICD-10-CM | POA: Insufficient documentation

## 2017-07-14 DIAGNOSIS — A749 Chlamydial infection, unspecified: Secondary | ICD-10-CM | POA: Diagnosis not present

## 2017-07-14 DIAGNOSIS — J45909 Unspecified asthma, uncomplicated: Secondary | ICD-10-CM

## 2017-07-14 DIAGNOSIS — O0990 Supervision of high risk pregnancy, unspecified, unspecified trimester: Secondary | ICD-10-CM | POA: Insufficient documentation

## 2017-07-14 DIAGNOSIS — O98812 Other maternal infectious and parasitic diseases complicating pregnancy, second trimester: Secondary | ICD-10-CM

## 2017-07-14 LAB — POCT URINALYSIS DIP (DEVICE)
BILIRUBIN URINE: NEGATIVE
GLUCOSE, UA: NEGATIVE mg/dL
Hgb urine dipstick: NEGATIVE
Ketones, ur: NEGATIVE mg/dL
NITRITE: NEGATIVE
Protein, ur: NEGATIVE mg/dL
SPECIFIC GRAVITY, URINE: 1.015 (ref 1.005–1.030)
UROBILINOGEN UA: 1 mg/dL (ref 0.0–1.0)
pH: 7 (ref 5.0–8.0)

## 2017-07-14 NOTE — Progress Notes (Signed)
   PRENATAL VISIT NOTE  Subjective:  Gail Garcia is a 32 y.o. [id number] at 311w5d being seen today for her first prenatal care visit.  She is currently monitored for the following issues for this low-risk pregnancy and has Supervision of other normal pregnancy, antepartum and Asthma affecting pregnancy in second trimester on their problem list.  Patient reports no complaints.  Contractions: Not present. Vag. Bleeding: None.  Movement: Present. Denies leaking of fluid.   The following portions of the patient's history were reviewed and updated as appropriate: allergies, current medications, past family history, past medical history, past social history, past surgical history and problem list. Problem list updated.  Objective:   Vitals:   07/14/17 1019  BP: 136/73  Pulse: 93  Weight: 189 lb 1.6 oz (85.8 kg)    Fetal Status: Fetal Heart Rate (bpm): 147   Movement: Present     General:  Alert, oriented and cooperative. Patient is in no acute distress.  Skin: Skin is warm and dry. No rash noted.   Cardiovascular: Normal heart rate and rhythm noted  Respiratory: Normal respiratory effort, no problems with respiration noted. Clear to auscultation.   Abdomen: Soft, gravid, appropriate for gestational age. No tenderness to palpation. Normal bowel sounds.  Pain/Pressure: Absent     Pelvic: Cervical exam performed Dilation: Closed Effacement (%): Thick  pap smear obtained. Normal discharge. Cervix with mild friability. No lesions.  Breast: symmetric, no tenderness, no masses, no discharge or bleeding from nipple  Extremities: Normal range of motion.  Edema: None  Mental Status: Normal mood and affect. Normal behavior. Normal judgment and thought content.   Assessment and Plan:  Pregnancy: [id number] at 6811w5d  1. Supervision of other normal pregnancy, antepartum - Culture, OB Urine - Cystic fibrosis gene test - Cytology - PAP - Genetic Screening - Hemoglobinopathy Evaluation - Obstetric  Panel, Including HIV - SMN1 COPY NUMBER ANALYSIS (SMA Carrier Screen) - US MFM OB COMP + 14 WK; scheduled - Will need AFP at [place] once dating is confirmed  2. Asthma affecting pregnancy in second trimester - No regular medications needed, only an issue with exertion at times   Preterm labor/second trimester warning symptoms and general obstetric precautions including but not limited to vaginal bleeding, contractions, leaking of fluid and fetal movement were reviewed in detail with the patient. Please refer to After Visit Summary for other counseling recommendations.  Return in about 1 month (around 08/11/2017) for LOB.  Future Appointments  Date Time Provider Department Center  08/02/2017  1:00 PM [facility] US 3 [facility]    Vonzella NippleJulie Julianne Chamberlin, PA-C

## 2017-07-14 NOTE — Patient Instructions (Signed)
Prenatal Care WHAT IS PRENATAL CARE? Prenatal care is the process of caring for a pregnant woman before she gives birth. Prenatal care makes sure that she and her baby remain as healthy as possible throughout pregnancy. Prenatal care may be provided by a midwife, family practice health care provider, or a childbirth and pregnancy specialist (obstetrician). Prenatal care may include physical examinations, testing, treatments, and education on nutrition, lifestyle, and social support services. WHY IS PRENATAL CARE SO IMPORTANT? Early and consistent prenatal care increases the chance that you and your baby will remain healthy throughout your pregnancy. This type of care also decreases a baby's risk of being born too early (prematurely), or being born smaller than expected (small for gestational age). Any underlying medical conditions you may have that could pose a risk during your pregnancy are discussed during prenatal care visits. You will also be monitored regularly for any new conditions that may arise during your pregnancy so they can be treated quickly and effectively. WHAT HAPPENS DURING PRENATAL CARE VISITS? Prenatal care visits may include the following: Discussion Tell your health care provider about any new signs or symptoms you have experienced since your last visit. These might include:  Nausea or vomiting.  Increased or decreased level of energy.  Difficulty sleeping.  Back or leg pain.  Weight changes.  Frequent urination.  Shortness of breath with physical activity.  Changes in your skin, such as the development of a rash or itchiness.  Vaginal discharge or bleeding.  Feelings of excitement or nervousness.  Changes in your baby's movements.  You may want to write down any questions or topics you want to discuss with your health care provider and bring them with you to your appointment. Examination During your first prenatal care visit, you will likely have a complete  physical exam. Your health care provider will often examine your vagina, cervix, and the position of your uterus, as well as check your heart, lungs, and other body systems. As your pregnancy progresses, your health care provider will measure the size of your uterus and your baby's position inside your uterus. He or she may also examine you for early signs of labor. Your prenatal visits may also include checking your blood pressure and, after about 10-12 weeks of pregnancy, listening to your baby's heartbeat. Testing Regular testing often includes:  Urinalysis. This checks your urine for glucose, protein, or signs of infection.  Blood count. This checks the levels of white and red blood cells in your body.  Tests for sexually transmitted infections (STIs). Testing for STIs at the beginning of pregnancy is routinely done and is required in many states.  Antibody testing. You will be checked to see if you are immune to certain illnesses, such as rubella, that can affect a developing fetus.  Glucose screen. Around 24-28 weeks of pregnancy, your blood glucose level will be checked for signs of gestational diabetes. Follow-up tests may be recommended.  Group B strep. This is a bacteria that is commonly found inside a woman's vagina. This test will inform your health care provider if you need an antibiotic to reduce the amount of this bacteria in your body prior to labor and childbirth.  Ultrasound. Many pregnant women undergo an ultrasound screening around 18-20 weeks of pregnancy to evaluate the health of the fetus and check for any developmental abnormalities.  HIV (human immunodeficiency virus) testing. Early in your pregnancy, you will be screened for HIV. If you are at high risk for HIV, this test may   be repeated during your third trimester of pregnancy.  You may be offered other testing based on your age, personal or family medical history, or other factors. HOW OFTEN SHOULD I PLAN TO SEE MY  HEALTH CARE PROVIDER FOR PRENATAL CARE? Your prenatal care check-up schedule depends on any medical conditions you have before, or develop during, your pregnancy. If you do not have any underlying medical conditions, you will likely be seen for checkups:  Monthly, during the first 6 months of pregnancy.  Twice a month during months 7 and 8 of pregnancy.  Weekly starting in the 9th month of pregnancy and until delivery.  If you develop signs of early labor or other concerning signs or symptoms, you may need to see your health care provider more often. Ask your health care provider what prenatal care schedule is best for you. WHAT CAN I DO TO KEEP MYSELF AND MY BABY AS HEALTHY AS POSSIBLE DURING MY PREGNANCY?  Take a prenatal vitamin containing 400 micrograms (0.4 mg) of folic acid every day. Your health care provider may also ask you to take additional vitamins such as iodine, vitamin D, iron, copper, and zinc.  Take 1500-2000 mg of calcium daily starting at your 20th week of pregnancy until you deliver your baby.  Make sure you are up to date on your vaccinations. Unless directed otherwise by your health care provider: ? You should receive a tetanus, diphtheria, and pertussis (Tdap) vaccination between the 27th and 36th week of your pregnancy, regardless of when your last Tdap immunization occurred. This helps protect your baby from whooping cough (pertussis) after he or she is born. ? You should receive an annual inactivated influenza vaccine (IIV) to help protect you and your baby from influenza. This can be done at any point during your pregnancy.  Eat a well-rounded diet that includes: ? Fresh fruits and vegetables. ? Lean proteins. ? Calcium-rich foods such as milk, yogurt, hard cheeses, and dark, leafy greens. ? Whole grain breads.  Do noteat seafood high in mercury, including: ? Swordfish. ? Tilefish. ? Shark. ? King mackerel. ? More than 6 oz tuna per week.  Do not  eat: ? Raw or undercooked meats or eggs. ? Unpasteurized foods, such as soft cheeses (brie, blue, or feta), juices, and milks. ? Lunch meats. ? Hot dogs that have not been heated until they are steaming.  Drink enough water to keep your urine clear or pale yellow. For many women, this may be 10 or more 8 oz glasses of water each day. Keeping yourself hydrated helps deliver nutrients to your baby and may prevent the start of pre-term uterine contractions.  Do not use any tobacco products including cigarettes, chewing tobacco, or electronic cigarettes. If you need help quitting, ask your health care provider.  Do not drink beverages containing alcohol. No safe level of alcohol consumption during pregnancy has been determined.  Do not use any illegal drugs. These can harm your developing baby or cause a miscarriage.  Ask your health care provider or pharmacist before taking any prescription or over-the-counter medicines, herbs, or supplements.  Limit your caffeine intake to no more than 200 mg per day.  Exercise. Unless told otherwise by your health care provider, try to get 30 minutes of moderate exercise most days of the week. Do not  do high-impact activities, contact sports, or activities with a high risk of falling, such as horseback riding or downhill skiing.  Get plenty of rest.  Avoid anything that raises your  body temperature, such as hot tubs and saunas.  If you own a cat, do not empty its litter box. Bacteria contained in cat feces can cause an infection called toxoplasmosis. This can result in serious harm to the fetus.  Stay away from chemicals such as insecticides, lead, mercury, and cleaning or paint products that contain solvents.  Do not have any X-rays taken unless medically necessary.  Take a childbirth and breastfeeding preparation class. Ask your health care provider if you need a referral or recommendation.  This information is not intended to replace advice given  to you by your health care provider. Make sure you discuss any questions you have with your health care provider. Document Released: 01/08/2003 Document Revised: 06/10/2015 Document Reviewed: 03/22/2013 Elsevier Interactive Patient Education  2017 [place] Education Options: [facility] Department Classes:  Childbirth education classes can help you get ready for a positive parenting experience. You can also meet other expectant parents and get free stuff for your baby. Each class runs for five weeks on the same night and costs $45 for the mother-to-be and her support person. Medicaid covers the cost if you are eligible. Call [phone] to register. [facility] Childbirth Education:  [phone] or [phone] or [email or web address]  Baby & Me Class: Discuss newborn & infant parenting and family adjustment issues with other new mothers in a relaxed environment. Each week brings a new speaker or baby-centered activity. We encourage new mothers to join [place] every Thursday at 11:00am. Babies birth until crawling. No registration or fee. Daddy [employer]: This course offers Dads-to-be the tools and knowledge needed to feel confident on their journey to becoming new fathers. Experienced dads, who have been trained as coaches, teach dads-to-be how to hold, comfort, diaper, swaddle and play with their infant while being able to support the new mom as well. A class for men taught by men. $25/dad Big Brother/Big Sister: Let your children share in the joy of a new brother or sister in this special class designed just for them. Class includes discussion about how families care for babies: swaddling, holding, diapering, safety as well as how they can be helpful in their new role. This class is designed for children ages 43 to 60, but any age is welcome. Please register each child individually. $5/child  Mom Talk: This mom-led group offers support and connection to mothers as  they journey through the adjustments and struggles of that sometimes overwhelming first year after the birth of a child. Tuesdays at 10:00am and Thursdays at 6:00pm. Babies welcome. No registration or fee. Breastfeeding Support Group: This group is a mother-to-mother support circle where moms have the opportunity to share their breastfeeding experiences. A Lactation Consultant is present for questions and concerns. Meets each Tuesday at 11:00am. No fee or registration. Breastfeeding Your Baby: Learn what to expect in the first days of breastfeeding your newborn.  This class will help you feel more confident with the skills needed to begin your breastfeeding experience. Many new mothers are concerned about breastfeeding after leaving the hospital. This class will also address the most common fears and challenges about breastfeeding during the first few weeks, months and beyond. (call for fee) Comfort Techniques and Tour: This 2 hour interactive class will provide you the opportunity to learn & practice hands-on techniques that can help relieve some of the discomfort of labor and encourage your baby to rotate toward the best position for birth. You and your partner will be able to try  a variety of labor positions with birth balls and rebozos as well as practice breathing, relaxation, and visualization techniques. A tour of the [facility] is included with this class. $20 per registrant and support person Childbirth Class- Weekend Option: This class is a Weekend version of our Birth & Baby series. It is designed for parents who have a difficult time fitting several weeks of classes into their schedule. It covers the care of your newborn and the basics of labor and childbirth. It also includes a [place] [facility] and lunch. The class is held two consecutive days: beginning on Friday evening from 6:30 - 8:30 p.m. and the next day, Saturday from 9 a.m. - 4 p.m.  (call for fee) Doren Custard Class: Interested in a waterbirth?  This informational class will help you discover whether waterbirth is the right fit for you. Education about waterbirth itself, supplies you would need and how to assemble your support team is what you can expect from this class. Some obstetrical practices require this class in order to pursue a waterbirth. (Not all obstetrical practices offer waterbirth-check with your healthcare provider.) Register only the expectant mom, but you are encouraged to bring your partner to class! Required if planning waterbirth, no fee. Infant/Child CPR: Parents, grandparents, babysitters, and friends learn Cardio-Pulmonary Resuscitation skills for infants and children. You will also learn how to treat both conscious and unconscious choking in infants and children. This Family & Friends program does not offer certification. Register each participant individually to ensure that enough mannequins are available. (Call for fee) Grandparent Love: Expecting a grandbaby? This class is for you! Learn about the latest infant care and safety recommendations and ways to support your own child as he or she transitions into the parenting role. Taught by Registered Nurses who are childbirth instructors, but most importantly...they are grandmothers too! $10/person. Childbirth Class- Natural Childbirth: This series of 5 weekly classes is for expectant parents who want to learn and practice natural methods of coping with the process of labor and childbirth. Relaxation, breathing, massage, visualization, role of the partner, and helpful positioning are highlighted. Participants learn how to be confident in their body's ability to give birth. This class will empower and help parents make informed decisions about their own care. Includes discussion that will help new parents transition into the immediate postpartum period. [facility] is included. We  suggest taking this class between 25-32 weeks, but it's only a recommendation. $75 per registrant and one support person or $30 Medicaid. Childbirth Class- 3 week Series: This option of 3 weekly classes helps you and your labor partner prepare for childbirth. Newborn care, labor & birth, cesarean birth, pain management, and comfort techniques are discussed and a [place] [facility] is included. The class meets at the same time, on the same day of the week for 3 consecutive weeks beginning with the starting date you choose. $60 for registrant and one support person.  Marvelous Multiples: Expecting twins, triplets, or more? This class covers the differences in labor, birth, parenting, and breastfeeding issues that face multiples' parents. NICU tour is included. Led by a Certified Childbirth Educator who is the mother of twins. No fee. Caring for Baby: This class is for expectant and adoptive parents who want to learn and practice the most up-to-date newborn care for their babies. Focus is on birth through the first six weeks of life. Topics include feeding, bathing, diapering,  crying, umbilical cord care, circumcision care and safe sleep. Parents learn to recognize symptoms of illness and when to call the pediatrician. Register only the mom-to-be and your partner or support person can plan to come with you! $10 per registrant and support person Childbirth Class- online option: This online class offers you the freedom to complete a Birth and Baby series in the comfort of your own home. The flexibility of this option allows you to review sections at your own pace, at times convenient to you and your support people. It includes additional video information, animations, quizzes, and extended activities. Get organized with helpful eClass tools, checklists, and trackers. Once you register online for the class, you will receive an email within a few days to accept the invitation and begin the  class when the time is right for you. The content will be available to you for 60 days. $60 for 60 days of online access for you and your support people.  Local Doulas: Natural Baby Doulas [email or web address] Tel: [phone] [email or web address] [employer] [phone] [email or web address] The Labor Hassell Halim  (also do waterbirth tub rental) [phone] [email or web address] Triad Birth Doula [phone] [email or web address] NotebookDistributors.fi Sacred Rhythms  [phone] https://[email or web address]-[email or web address]/ [employer] (PADA) [email or web address]://www.[email or web address]/ [place] Birth and Baby  [email or web address]/ Considering Waterbirth? Guide for patients at [facility] [employer]  Why consider waterbirth?  . Gentle birth for babies . Less pain medicine used in labor . May allow for passive descent/less pushing . May reduce perineal tears  . More mobility and instinctive maternal position changes . Increased maternal relaxation . Reduced blood pressure in labor  Is waterbirth safe? What are the risks of infection, drowning or other complications?  . Infection: o Very low risk (3.7 % for tub vs 4.8% for bed) o 7 in 8000 waterbirths with documented infection o Poorly cleaned equipment most common cause o Slightly lower group B strep transmission rate  . Drowning o Maternal:  - Very low risk   - Related to seizures or fainting o Newborn:  - Very low risk. No evidence of increased risk of respiratory problems in multiple large studies - Physiological protection from breathing under water - Avoid underwater birth if there are any fetal complications - Once baby's head is out of the water, keep it out.  . Birth complication o Some reports of cord trauma, but risk decreased by bringing baby to surface  gradually o No evidence of increased risk of shoulder dystocia. Mothers can usually change positions faster in water than in a bed, possibly aiding the maneuvers to free the shoulder.   You must attend a Doren Custard class at [facility]  3rd Wednesday of every month from 7-9pm  [employer] by calling [phone] or online at [email or web address]  Bring [place] the certificate from the class to your prenatal appointment  Meet with a midwife at 36 weeks to see if you can still plan a waterbirth and to sign the consent.   Purchase or rent the following supplies:   Water Birth Pool (Birth Pool in a Box or [place] for instance)  (Tubs start ~$125)  Single-use disposable tub liner designed for your brand of tub  New garden hose labeled "lead-free", "suitable for drinking water",  Electric drain pump to remove water (We recommend 792 gallon per hour or greater pump.)   Separate garden hose to remove the dirty water  Fish net  Bathing suit top (optional)  Long-handled mirror (optional)  Places to purchase or rent supplies  GotWebTools.is for tub purchases and supplies  Waterbirthsolutions.com for tub purchases and supplies  The Labor Ladies (www.[email or web address]) $275 for tub rental/set-up & take down/kit   [employer] (http://www.fleming.com/.htm) Information regarding doulas (labor support) who provide pool rentals  Our practice has a Birth Pool in a Box tub at the hospital that you may borrow on a first-come-first-served basis. It is your responsibility to to set up, clean and break down the tub. We cannot guarantee the availability of this tub in advance. You are responsible for bringing all accessories listed above. If you do not have all necessary supplies you cannot have a waterbirth.    Things that would prevent you from having a waterbirth:  Premature, <37wks  Previous cesarean birth  Presence of thick meconium-stained  fluid  Multiple gestation (Twins, triplets, etc.)  Uncontrolled diabetes or gestational diabetes requiring medication  Hypertension requiring medication or diagnosis of pre-eclampsia  Heavy vaginal bleeding  Non-reassuring fetal heart rate  Active infection (MRSA, etc.). Group B Strep is NOT a contraindication for  waterbirth.  If your labor has to be induced and induction method requires continuous  monitoring of the baby's heart rate  Other risks/issues identified by your obstetrical provider  Please remember that birth is unpredictable. Under certain unforeseeable circumstances your provider may advise against giving birth in the tub. These decisions will be made on a case-by-case basis and with the safety of you and your baby as our highest priority.     AREA PEDIATRIC/FAMILY [place] [address] [place], [place]  [address] Phone - [phone]   Fax - [phone]  [facility] [place] [address] [place], [place] [address] Phone - [phone]   Fax - [place] [address], [place]  [address] Phone - [phone]   Fax - [phone]  [place] [place] [address] [place], [place]  [address] Phone - [phone]   Fax - [phone]  [place] [address] [place], [place]  [address] Phone - [phone]   Fax - [phone]  [facility] [address] [place], [place]  [address] Phone - [phone]   Fax - [place] [address], [place], [place]  [address] Phone - [phone]   Fax - [phone]  [place] [address] [place], [place] [address] [place], [place]  [address] Phone - [phone]   Fax - [place] [address] [place], [place]  [address] Phone - [phone]    Fax - [phone] [facility] [place]. [address] [place], [place]  [address] Phone - [phone]   Fax - [phone]  [place] [address] [place], [place]  [address] Phone - [phone]   Fax - [phone]  [facility] [place], [place], [place]  [address] Phone - [phone]   Fax - [place] [address] [place], [place]  [address] Phone - [phone]   Fax - [phone]  [facility] [address], [place], [place]  [address] Phone - [place], [place]  [address] Phone - [phone]   Fax - [place] [address], [place], [place]  [address] Phone -  [phone]   Fax - [phone]  [place] [address] [place], [place]  [address] Phone - [phone]   Fax - [place], [place]  [address] Phone - [phone]   Fax - [place], [place], [place]  [address] Phone - [phone]   Fax - [place] [address], [place], [place]  [address] Phone - [phone]   Fax - [phone]  [place] RUB[place] [address], [place], [place]  [address] Phone - [phone]   Fax - [place] [address], [place], [place]  [address] Phone - [phone]   Fax - [phone]  [place] [address] [place], [place]  [address] Phone - [phone]   Fax - [phone] [place] [address] [place], [place]  [address] Phone - [phone]   Fax - [place] [address] [place], [place]  [address] Phone - [phone]   Fax -  [place] [address], [place], [place]  [address] Phone - [phone]   Fax - [phone]  [place] [address] [place] [address] Phone [phone]  Fax [phone]  Safe Medications in Pregnancy   Acne:  Benzoyl Peroxide  Salicylic Acid   Backache/Headache:  Tylenol: 2 regular strength every 4 hours OR        2 Extra strength every 6 hours   Colds/Coughs/Allergies:  Benadryl (alcohol free) 25 mg every 6 hours as needed  Breath right strips  Claritin  Cepacol throat lozenges  Chloraseptic throat spray  Cold-Eeze- up to three times per day  Cough drops, alcohol free  Flonase (by prescription only)  Guaifenesin  Mucinex  Robitussin DM (plain only, alcohol free)  Saline nasal spray/drops  Sudafed (pseudoephedrine) & Actifed * use only after [redacted] weeks gestation and if you do not have high blood pressure  Tylenol  Vicks Vaporub  Zinc lozenges  Zyrtec   Constipation:  Colace  Ducolax suppositories  Fleet enema  Glycerin suppositories  Metamucil  Milk of magnesia  Miralax  Senokot  Smooth move tea   Diarrhea:  Kaopectate  Imodium A-D   *NO pepto Bismol   Hemorrhoids:  Anusol  Anusol HC  Preparation H  Tucks   Indigestion:  Tums  Maalox  Mylanta  Zantac  Pepcid   Insomnia:  Benadryl (alcohol free) 5m every 6 hours as needed  Tylenol PM  Unisom, no Gelcaps   Leg Cramps:  Tums  MagGel   Nausea/Vomiting:  Bonine  Dramamine  Emetrol  Ginger extract  Sea bands  Meclizine  Nausea medication to take during pregnancy:  Unisom (doxylamine succinate 25 mg tablets) Take one tablet daily at bedtime. If symptoms are not adequately controlled, the dose can be increased to a maximum recommended dose of two tablets daily (1/2 tablet in the morning, 1/2 tablet mid-afternoon and one at bedtime).  Vitamin B6 1085mtablets.  Take one tablet twice a day (up to 200 mg per day).   Skin Rashes:  Aveeno products  Benadryl cream or 2543mvery 6 hours as needed  Calamine Lotion  1% cortisone cream   Yeast infection:  Gyne-lotrimin 7  Monistat 7    **If taking multiple medications, please check labels to avoid  duplicating the same active ingredients  **take medication as directed on the label  ** Do not exceed 4000 mg of tylenol in 24 hours  **Do not take medications that contain aspirin or ibuprofen

## 2017-07-16 ENCOUNTER — Encounter: Payer: Self-pay | Admitting: Medical

## 2017-07-16 DIAGNOSIS — A568 Sexually transmitted chlamydial infection of other sites: Secondary | ICD-10-CM | POA: Insufficient documentation

## 2017-07-16 DIAGNOSIS — B977 Papillomavirus as the cause of diseases classified elsewhere: Secondary | ICD-10-CM | POA: Insufficient documentation

## 2017-07-16 DIAGNOSIS — O98312 Other infections with a predominantly sexual mode of transmission complicating pregnancy, second trimester: Secondary | ICD-10-CM

## 2017-07-16 LAB — CULTURE, OB URINE

## 2017-07-16 LAB — URINE CULTURE, OB REFLEX

## 2017-07-16 LAB — CYTOLOGY - PAP
CHLAMYDIA, DNA PROBE: POSITIVE — AB
DIAGNOSIS: NEGATIVE
HPV (WINDOPATH): DETECTED — AB
NEISSERIA GONORRHEA: NEGATIVE

## 2017-07-16 MED ORDER — AZITHROMYCIN 250 MG PO TABS: 1000.0000 mg | ORAL_TABLET | Freq: Once | ORAL | 0 refills | Status: AC

## 2017-07-16 NOTE — Addendum Note (Signed)
Addended by: Marny LowensteinWENZEL, Faatimah Spielberg N on: 07/16/2017 09:07 AM   Modules accepted: Orders

## 2017-07-19 ENCOUNTER — Telehealth: Payer: Self-pay | Admitting: *Deleted

## 2017-07-19 NOTE — Telephone Encounter (Signed)
-----   Message from Marny LowensteinJulie N Wenzel, PA-C sent at 07/16/2017  9:07 AM EDT ----- Patient + chlamydia at new OB, normal pap with +HPV. Needs repeat pap in 1 year. Rx for Zithromax sent. Please inform patient. Need for partner treatment and abstinence x 2 weeks. TOC mid July.   Marny LowensteinWenzel, Julie N, PA-C 07/16/2017 9:05 AM

## 2017-07-19 NOTE — Telephone Encounter (Signed)
Called Gail Garcia and notified of + chlamydia and rx sent to pharmacy, that she and partner need treatment and avoid intercourse/ intimate contact for 2 weeks until both are treated. Also informed patient + hpv and needs pap with cotesting in one year. She voices understanding. STD form completed.

## 2017-07-21 LAB — HEMOGLOBINOPATHY EVALUATION
FERRITIN: 69 ng/mL (ref 15–150)
HGB A2 QUANT: 2.3 % (ref 1.8–3.2)
HGB C: 0 %
Hgb A: 97.2 % (ref 96.4–98.8)
Hgb F Quant: 0.5 % (ref 0.0–2.0)
Hgb S: 0 %
Hgb Solubility: NEGATIVE
Hgb Variant: 0 %

## 2017-07-21 LAB — OBSTETRIC PANEL, INCLUDING HIV
Antibody Screen: NEGATIVE
BASOS ABS: 0 10*3/uL (ref 0.0–0.2)
Basos: 0 %
EOS (ABSOLUTE): 0 10*3/uL (ref 0.0–0.4)
Eos: 0 %
HIV SCREEN 4TH GENERATION: NONREACTIVE
Hematocrit: 33.7 % — ABNORMAL LOW (ref 34.0–46.6)
Hemoglobin: 10.9 g/dL — ABNORMAL LOW (ref 11.1–15.9)
Hepatitis B Surface Ag: NEGATIVE
IMMATURE GRANS (ABS): 0 10*3/uL (ref 0.0–0.1)
Immature Granulocytes: 0 %
LYMPHS: 20 %
Lymphocytes Absolute: 2.3 10*3/uL (ref 0.7–3.1)
MCH: 28.9 pg (ref 26.6–33.0)
MCHC: 32.3 g/dL (ref 31.5–35.7)
MCV: 89 fL (ref 79–97)
MONOCYTES: 4 %
Monocytes Absolute: 0.5 10*3/uL (ref 0.1–0.9)
Neutrophils Absolute: 8.7 10*3/uL — ABNORMAL HIGH (ref 1.4–7.0)
Neutrophils: 76 %
PLATELETS: 329 10*3/uL (ref 150–450)
RBC: 3.77 x10E6/uL (ref 3.77–5.28)
RDW: 13.1 % (ref 12.3–15.4)
RPR Ser Ql: NONREACTIVE
RUBELLA: 1.73 {index} (ref >0.99)
Rh Factor: POSITIVE
WBC: 11.6 10*3/uL — ABNORMAL HIGH (ref 3.4–10.8)

## 2017-07-21 LAB — SMN1 COPY NUMBER ANALYSIS (SMA CARRIER SCREENING)

## 2017-07-21 LAB — CYSTIC FIBROSIS GENE TEST

## 2017-07-26 ENCOUNTER — Encounter: Payer: Self-pay | Admitting: *Deleted

## 2017-07-28 ENCOUNTER — Encounter (HOSPITAL_COMMUNITY): Payer: Self-pay

## 2017-07-28 ENCOUNTER — Encounter: Payer: Self-pay | Admitting: *Deleted

## 2017-08-02 ENCOUNTER — Other Ambulatory Visit: Payer: Self-pay | Admitting: Medical

## 2017-08-02 ENCOUNTER — Encounter (HOSPITAL_COMMUNITY): Payer: Self-pay

## 2017-08-02 ENCOUNTER — Other Ambulatory Visit (HOSPITAL_COMMUNITY): Payer: Self-pay | Admitting: *Deleted

## 2017-08-02 ENCOUNTER — Ambulatory Visit (HOSPITAL_COMMUNITY)
Admission: RE | Admit: 2017-08-02 | Discharge: 2017-08-02 | Disposition: A | Discharge status: Home / Self Care | Payer: Medicaid Other | Source: Ambulatory Visit | Attending: Medical | Admitting: Medical

## 2017-08-02 DIAGNOSIS — Z363 Encounter for antenatal screening for malformations: Secondary | ICD-10-CM | POA: Diagnosis not present

## 2017-08-02 DIAGNOSIS — Z3687 Encounter for antenatal screening for uncertain dates: Secondary | ICD-10-CM | POA: Diagnosis not present

## 2017-08-02 DIAGNOSIS — Z3A15 15 weeks gestation of pregnancy: Secondary | ICD-10-CM | POA: Diagnosis not present

## 2017-08-02 DIAGNOSIS — Z362 Encounter for other antenatal screening follow-up: Secondary | ICD-10-CM

## 2017-08-02 DIAGNOSIS — Z3480 Encounter for supervision of other normal pregnancy, unspecified trimester: Secondary | ICD-10-CM

## 2017-08-15 ENCOUNTER — Encounter: Payer: Self-pay | Admitting: Student

## 2017-08-15 DIAGNOSIS — O4442 Low lying placenta NOS or without hemorrhage, second trimester: Secondary | ICD-10-CM | POA: Insufficient documentation

## 2017-08-16 ENCOUNTER — Ambulatory Visit (INDEPENDENT_AMBULATORY_CARE_PROVIDER_SITE_OTHER): Payer: Medicaid Other | Admitting: Student

## 2017-08-16 ENCOUNTER — Other Ambulatory Visit (HOSPITAL_COMMUNITY)
Admission: RE | Admit: 2017-08-16 | Discharge: 2017-08-16 | Disposition: A | Discharge status: Home / Self Care | Payer: Medicaid Other | Source: Ambulatory Visit | Attending: Student | Admitting: Student

## 2017-08-16 VITALS — BP 131/80 | HR 89 | Wt 198.0 lb

## 2017-08-16 DIAGNOSIS — O98812 Other maternal infectious and parasitic diseases complicating pregnancy, second trimester: Secondary | ICD-10-CM

## 2017-08-16 DIAGNOSIS — O98312 Other infections with a predominantly sexual mode of transmission complicating pregnancy, second trimester: Secondary | ICD-10-CM

## 2017-08-16 DIAGNOSIS — Z3480 Encounter for supervision of other normal pregnancy, unspecified trimester: Secondary | ICD-10-CM

## 2017-08-16 DIAGNOSIS — A749 Chlamydial infection, unspecified: Secondary | ICD-10-CM | POA: Diagnosis not present

## 2017-08-16 DIAGNOSIS — Z3A17 17 weeks gestation of pregnancy: Secondary | ICD-10-CM | POA: Insufficient documentation

## 2017-08-16 DIAGNOSIS — A568 Sexually transmitted chlamydial infection of other sites: Secondary | ICD-10-CM

## 2017-08-16 NOTE — Progress Notes (Signed)
   PRENATAL VISIT NOTE  Subjective:  Gail Garcia is a 32 y.o. [phone] at 68107w6d being seen today for ongoing prenatal care.  She is currently monitored for the following issues for this low-risk pregnancy and has Supervision of other normal pregnancy, antepartum; Asthma affecting pregnancy in second trimester; HPV in female; Chlamydia trachomatis infection in pregnancy in second trimester; and Low-lying placenta in second trimester on their problem list.  Patient reports no complaints.  Contractions: Not present. Vag. Bleeding: None.  Movement: Present. Denies leaking of fluid.  Discussed previous diagnosis of chlamydia. Patient doesn't think her partner has been treated & states she may have had intercourse with him since she was treated. Denies symptoms.   The following portions of the patient's history were reviewed and updated as appropriate: allergies, current medications, past family history, past medical history, past social history, past surgical history and problem list. Problem list updated.  Objective:   Vitals:   08/16/17 1115  BP: 131/80  Pulse: 89  Weight: 198 lb (89.8 kg)    Fetal Status: Fetal Heart Rate (bpm): 154   Movement: Present   Fundal height 1 FB below umbilicus  General:  Alert, oriented and cooperative. Patient is in no acute distress.  Skin: Skin is warm and dry. No rash noted.   Cardiovascular: Normal heart rate noted  Respiratory: Normal respiratory effort, no problems with respiration noted  Abdomen: Soft, gravid, appropriate for gestational age.  Pain/Pressure: Absent     Pelvic: Cervical exam deferred        Extremities: Normal range of motion.  Edema: Trace  Mental Status: Normal mood and affect. Normal behavior. Normal judgment and thought content.   Assessment and Plan:  Pregnancy: [id number] at 44107w6d  1. Supervision of other normal pregnancy, antepartum -NIPs low risk -Pt thinks she had a niece with gastroschisis. Anatomy ultrasound unable to  view spine, has f/u scheduled. Discussed AFP & is agreeable to having it drawn today.  - AFP, Serum, Open Spina Bifida  2. Chlamydia trachomatis infection in pregnancy in second trimester -[facility]. Instructed not to have any intercourse with anyone until 1+ wk after treatment and after results of her TOC are back - Cervicovaginal ancillary only  Preterm labor symptoms and general obstetric precautions including but not limited to vaginal bleeding, contractions, leaking of fluid and fetal movement were reviewed in detail with the patient. Please refer to After Visit Summary for other counseling recommendations.  Return in about 1 month (around 09/13/2017) for Routine OB.  Future Appointments  Date Time Provider Department Center  08/30/2017 12:45 PM [facility] US 2 [facility]    Judeth HornErin Curtina Grills, NP

## 2017-08-16 NOTE — Patient Instructions (Signed)
Second Trimester of Pregnancy The second trimester is from week 13 through week 28, month 4 through 6. This is often the time in pregnancy that you feel your best. Often times, morning sickness has lessened or quit. You may have more energy, and you may get hungry more often. Your unborn baby (fetus) is growing rapidly. At the end of the sixth month, he or she is about 9 inches long and weighs about 1 pounds. You will likely feel the baby move (quickening) between 18 and 20 weeks of pregnancy.  Research childbirth classes and hospital preregistration at [email or web address]  Follow these instructions at home:  Avoid all smoking, herbs, and alcohol. Avoid drugs not approved by your doctor.  Do not use any tobacco products, including cigarettes, chewing tobacco, and electronic cigarettes. If you need help quitting, ask your doctor. You may get counseling or other support to help you quit.  Only take medicine as told by your doctor. Some medicines are safe and some are not during pregnancy.  Exercise only as told by your doctor. Stop exercising if you start having cramps.  Eat regular, healthy meals.  Wear a good support bra if your breasts are tender.  Do not use hot tubs, steam rooms, or saunas.  Wear your seat belt when driving.  Avoid raw meat, uncooked cheese, and liter boxes and soil used by cats.  Take your prenatal vitamins.  Take 1500-2000 milligrams of calcium daily starting at the 20th week of pregnancy until you deliver your baby.  Try taking medicine that helps you poop (stool softener) as needed, and if your doctor approves. Eat more fiber by eating fresh fruit, vegetables, and whole grains. Drink enough fluids to keep your pee (urine) clear or pale yellow.  Take warm water baths (sitz baths) to soothe pain or discomfort caused by hemorrhoids. Use hemorrhoid cream if your doctor approves.  If you have puffy, bulging veins (varicose veins), wear support hose. Raise  (elevate) your feet for 15 minutes, 3-4 times a day. Limit salt in your diet.  Avoid heavy lifting, wear low heals, and sit up straight.  Rest with your legs raised if you have leg cramps or low back pain.  Visit your dentist if you have not gone during your pregnancy. Use a soft toothbrush to brush your teeth. Be gentle when you floss.  You can have sex (intercourse) unless your doctor tells you not to.  Go to your doctor visits.  Get help if:  You feel dizzy.  You have mild cramps or pressure in your lower belly (abdomen).  You have a nagging pain in your belly area.  You continue to feel sick to your stomach (nauseous), throw up (vomit), or have watery poop (diarrhea).  You have bad smelling fluid coming from your vagina.  You have pain with peeing (urination). Get help right away if:  You have a fever.  You are leaking fluid from your vagina.  You have spotting or bleeding from your vagina.  You have severe belly cramping or pain.  You lose or gain weight rapidly.  You have trouble catching your breath and have chest pain.  You notice sudden or extreme puffiness (swelling) of your face, hands, ankles, feet, or legs.  You have not felt the baby move in over an hour.  You have severe headaches that do not go away with medicine.  You have vision changes. This information is not intended to replace advice given to you by your health care provider. Make   sure you discuss any questions you have with your health care provider. Document Released: 04/01/2009 Document Revised: 06/13/2015 Document Reviewed: 03/08/2012 Elsevier Interactive Patient Education  2017 Elsevier Inc.    

## 2017-08-18 LAB — AFP, SERUM, OPEN SPINA BIFIDA
AFP MoM: 1.02
AFP Value: 39.9 ng/mL
Gest. Age on Collection Date: 17.9 weeks
Maternal Age At EDD: 31.1 yr
OSBR Risk 1 IN: 10000
Test Results:: NEGATIVE
Weight: 198 [lb_av]

## 2017-08-18 LAB — CERVICOVAGINAL ANCILLARY ONLY
CHLAMYDIA, DNA PROBE: POSITIVE — AB
Neisseria Gonorrhea: NEGATIVE

## 2017-08-18 MED ORDER — AZITHROMYCIN 500 MG PO TABS: 1000.0000 mg | ORAL_TABLET | Freq: Once | ORAL | 0 refills | Status: AC

## 2017-08-18 NOTE — Addendum Note (Signed)
Addended by: Judeth HornLAWRENCE, Balthazar Dooly B on: 08/18/2017 08:25 AM   Modules accepted: Orders

## 2017-08-19 ENCOUNTER — Telehealth: Payer: Self-pay

## 2017-08-19 NOTE — Telephone Encounter (Signed)
Called pt today due to Infectious Disease Report showing positive for Chlamydia. Saw that Zithromax was ordered by provider on 08/16/17.No answer, so asked if she could call us back regarding test Results.

## 2017-08-24 NOTE — Telephone Encounter (Signed)
Called patient to make sure she was aware of positive chlamydia results. Patient is aware but has not been able to get her medicine due to not having a ride to the pharmacy. I have advised patient to try and find a way to the pharmacy due to the importance of her being treated and her being pregnant.

## 2017-08-30 ENCOUNTER — Other Ambulatory Visit (HOSPITAL_COMMUNITY): Payer: Self-pay | Admitting: Obstetrics and Gynecology

## 2017-08-30 ENCOUNTER — Ambulatory Visit (HOSPITAL_COMMUNITY)
Admission: RE | Admit: 2017-08-30 | Discharge: 2017-08-30 | Disposition: A | Discharge status: Home / Self Care | Payer: Medicaid Other | Source: Ambulatory Visit | Attending: Medical | Admitting: Medical

## 2017-08-30 DIAGNOSIS — O4442 Low lying placenta NOS or without hemorrhage, second trimester: Secondary | ICD-10-CM | POA: Diagnosis not present

## 2017-08-30 DIAGNOSIS — Z362 Encounter for other antenatal screening follow-up: Secondary | ICD-10-CM | POA: Insufficient documentation

## 2017-08-30 DIAGNOSIS — Z3A19 19 weeks gestation of pregnancy: Secondary | ICD-10-CM | POA: Insufficient documentation

## 2017-09-13 ENCOUNTER — Other Ambulatory Visit (HOSPITAL_COMMUNITY)
Admission: RE | Admit: 2017-09-13 | Discharge: 2017-09-13 | Disposition: A | Discharge status: Home / Self Care | Payer: Medicaid Other | Source: Ambulatory Visit | Attending: Advanced Practice Midwife | Admitting: Advanced Practice Midwife

## 2017-09-13 ENCOUNTER — Ambulatory Visit (INDEPENDENT_AMBULATORY_CARE_PROVIDER_SITE_OTHER): Payer: Medicaid Other | Admitting: Advanced Practice Midwife

## 2017-09-13 ENCOUNTER — Encounter: Payer: Self-pay | Admitting: Advanced Practice Midwife

## 2017-09-13 DIAGNOSIS — Z3A21 21 weeks gestation of pregnancy: Secondary | ICD-10-CM | POA: Insufficient documentation

## 2017-09-13 DIAGNOSIS — Z3480 Encounter for supervision of other normal pregnancy, unspecified trimester: Secondary | ICD-10-CM

## 2017-09-13 DIAGNOSIS — Z3482 Encounter for supervision of other normal pregnancy, second trimester: Secondary | ICD-10-CM | POA: Insufficient documentation

## 2017-09-13 DIAGNOSIS — O4442 Low lying placenta NOS or without hemorrhage, second trimester: Secondary | ICD-10-CM

## 2017-09-13 NOTE — Progress Notes (Signed)
   PRENATAL VISIT NOTE  Subjective:  Gail Garcia is a 32 y.o. [phone] at 7727w6d being seen today for ongoing prenatal care.  She is currently monitored for the following issues for this low-risk pregnancy and has Supervision of other normal pregnancy, antepartum; Asthma affecting pregnancy in second trimester; HPV in female; Chlamydia trachomatis infection in pregnancy in second trimester; and Low-lying placenta in second trimester on their problem list.  Patient reports no complaints.  Contractions: Not present. Vag. Bleeding: None.  Movement: Present. Denies leaking of fluid.   The following portions of the patient's history were reviewed and updated as appropriate: allergies, current medications, past family history, past medical history, past social history, past surgical history and problem list. Problem list updated.  Objective:   Vitals:   09/13/17 1330  BP: 130/75  Pulse: 87  Weight: 201 lb (91.2 kg)    Fetal Status: Fetal Heart Rate (bpm): 154   Movement: Present     General:  Alert, oriented and cooperative. Patient is in no acute distress.  Skin: Skin is warm and dry. No rash noted.   Cardiovascular: Normal heart rate noted  Respiratory: Normal respiratory effort, no problems with respiration noted  Abdomen: Soft, gravid, appropriate for gestational age.  Pain/Pressure: Present     Pelvic: Cervical exam deferred        Extremities: Normal range of motion.  Edema: None  Mental Status: Normal mood and affect. Normal behavior. Normal judgment and thought content.   Assessment and Plan:  Pregnancy: [id number] at 2627w6d  1. Supervision of other normal pregnancy, antepartum - Routine care  - Cervicovaginal ancillary only - CBC; Future - HIV antibody; Future - RPR; Future - Glucose Tolerance, 2 Hours w/1 Hour; Future  Preterm labor symptoms and general obstetric precautions including but not limited to vaginal bleeding, contractions, leaking of fluid and fetal movement were  reviewed in detail with the patient. Please refer to After Visit Summary for other counseling recommendations.  Return in about 5 weeks (around 10/18/2017) for 2 hour GTT and 28 week labs at next visit .  No future appointments.  Thressa ShellerHeather Hogan, CNM

## 2017-09-14 LAB — CERVICOVAGINAL ANCILLARY ONLY
Chlamydia: NEGATIVE
Neisseria Gonorrhea: NEGATIVE

## 2017-09-28 ENCOUNTER — Encounter: Payer: Self-pay | Admitting: *Deleted

## 2017-10-11 ENCOUNTER — Encounter: Payer: Self-pay | Admitting: *Deleted

## 2017-10-18 ENCOUNTER — Other Ambulatory Visit: Payer: Medicaid Other

## 2017-10-18 ENCOUNTER — Ambulatory Visit (INDEPENDENT_AMBULATORY_CARE_PROVIDER_SITE_OTHER): Payer: Medicaid Other | Admitting: Advanced Practice Midwife

## 2017-10-18 ENCOUNTER — Encounter: Payer: Self-pay | Admitting: Advanced Practice Midwife

## 2017-10-18 VITALS — BP 126/68 | HR 88 | Wt 203.0 lb

## 2017-10-18 DIAGNOSIS — Z3480 Encounter for supervision of other normal pregnancy, unspecified trimester: Secondary | ICD-10-CM

## 2017-10-18 DIAGNOSIS — Z23 Encounter for immunization: Secondary | ICD-10-CM

## 2017-10-18 DIAGNOSIS — Z3482 Encounter for supervision of other normal pregnancy, second trimester: Secondary | ICD-10-CM | POA: Diagnosis not present

## 2017-10-18 MED ORDER — COMFORT FIT MATERNITY SUPP MED MISC: 1.0000 | Freq: Every day | 0 refills | Status: DC | PRN

## 2017-10-18 NOTE — Patient Instructions (Signed)

## 2017-10-18 NOTE — Progress Notes (Signed)
   PRENATAL VISIT NOTE  Subjective:  Gail Garcia is a 32 y.o. [phone] at [redacted]w[redacted]d being seen today for ongoing prenatal care.  She is currently monitored for the following issues for this low-risk pregnancy and has Supervision of other normal pregnancy, antepartum; Asthma affecting pregnancy in second trimester; HPV in female; Chlamydia trachomatis infection in pregnancy in second trimester; and Low-lying placenta in second trimester on their problem list.  Patient reports backache.  Contractions: Not present. Vag. Bleeding: None.  Movement: Present. Denies leaking of fluid.   The following portions of the patient's history were reviewed and updated as appropriate: allergies, current medications, past family history, past medical history, past social history, past surgical history and problem list. Problem list updated.  Objective:   Vitals:   10/18/17 0909  BP: 126/68  Pulse: 88  Weight: 203 lb (92.1 kg)    Fetal Status: Fetal Heart Rate (bpm): 147 Fundal Height: 27 cm Movement: Present     General:  Alert, oriented and cooperative. Patient is in no acute distress.  Skin: Skin is warm and dry. No rash noted.   Cardiovascular: Normal heart rate noted  Respiratory: Normal respiratory effort, no problems with respiration noted  Abdomen: Soft, gravid, appropriate for gestational age.  Pain/Pressure: Present     Pelvic: Cervical exam deferred        Extremities: Normal range of motion.  Edema: None  Mental Status: Normal mood and affect. Normal behavior. Normal judgment and thought content.   Assessment and Plan:  Pregnancy: A5W0981 at [redacted]w[redacted]d  1. Need for Tdap vaccination - Tdap vaccine greater than or equal to 7yo IM  2. Supervision of other normal pregnancy, antepartum - Flu Vaccine QUAD 36+ mos  - 28 week labs and 2 hour GTT today  - Rx for maternity support belt and instructions given to patient today  Preterm labor symptoms and general obstetric precautions including but not  limited to vaginal bleeding, contractions, leaking of fluid and fetal movement were reviewed in detail with the patient. Please refer to After Visit Summary for other counseling recommendations.  Return in about 3 weeks (around 11/08/2017).  No future appointments.  Thressa Sheller, CNM

## 2017-10-19 ENCOUNTER — Encounter: Payer: Self-pay | Admitting: Advanced Practice Midwife

## 2017-10-19 DIAGNOSIS — O24419 Gestational diabetes mellitus in pregnancy, unspecified control: Secondary | ICD-10-CM | POA: Insufficient documentation

## 2017-10-19 LAB — RPR: RPR Ser Ql: NONREACTIVE

## 2017-10-19 LAB — CBC
Hematocrit: 29.6 % — ABNORMAL LOW (ref 34.0–46.6)
Hemoglobin: 9.7 g/dL — ABNORMAL LOW (ref 11.1–15.9)
MCH: 28.3 pg (ref 26.6–33.0)
MCHC: 32.8 g/dL (ref 31.5–35.7)
MCV: 86 fL (ref 79–97)
PLATELETS: 311 10*3/uL (ref 150–450)
RBC: 3.43 x10E6/uL — AB (ref 3.77–5.28)
RDW: 14.5 % (ref 12.3–15.4)
WBC: 11.7 10*3/uL — AB (ref 3.4–10.8)

## 2017-10-19 LAB — HIV ANTIBODY (ROUTINE TESTING W REFLEX): HIV SCREEN 4TH GENERATION: NONREACTIVE

## 2017-10-19 LAB — GLUCOSE TOLERANCE, 2 HOURS W/ 1HR
GLUCOSE, 1 HOUR: 195 mg/dL — AB (ref 65–179)
GLUCOSE, 2 HOUR: 134 mg/dL (ref 65–152)
Glucose, Fasting: 94 mg/dL — ABNORMAL HIGH (ref 65–91)

## 2017-11-08 ENCOUNTER — Ambulatory Visit (INDEPENDENT_AMBULATORY_CARE_PROVIDER_SITE_OTHER): Payer: Medicaid Other | Admitting: Advanced Practice Midwife

## 2017-11-08 ENCOUNTER — Encounter: Payer: Self-pay | Admitting: Advanced Practice Midwife

## 2017-11-08 VITALS — BP 136/73 | HR 110 | Wt 204.0 lb

## 2017-11-08 DIAGNOSIS — Z3480 Encounter for supervision of other normal pregnancy, unspecified trimester: Secondary | ICD-10-CM

## 2017-11-08 DIAGNOSIS — Z3483 Encounter for supervision of other normal pregnancy, third trimester: Secondary | ICD-10-CM

## 2017-11-08 NOTE — Patient Instructions (Signed)

## 2017-11-08 NOTE — Progress Notes (Signed)
   PRENATAL VISIT NOTE  Subjective:  Gail Garcia is a 32 y.o. [phone] at [redacted]w[redacted]d being seen today for ongoing prenatal care.  She is currently monitored for the following issues for this high-risk pregnancy and has Supervision of other normal pregnancy, antepartum; Asthma affecting pregnancy in second trimester; HPV in female; Chlamydia trachomatis infection in pregnancy in second trimester; Low-lying placenta in second trimester; and Gestational diabetes on their problem list.  Patient reports no complaints.  Contractions: Not present. Vag. Bleeding: None.  Movement: Present. Denies leaking of fluid.   The following portions of the patient's history were reviewed and updated as appropriate: allergies, current medications, past family history, past medical history, past social history, past surgical history and problem list. Problem list updated.  Objective:   Vitals:   11/08/17 1518 11/08/17 1524  BP: (!) 126/102 136/73  Pulse: (!) 115 (!) 110  Weight: 204 lb (92.5 kg)    Patient talking during first b/p check   Fetal Status: Fetal Heart Rate (bpm): 154   Movement: Present     General:  Alert, oriented and cooperative. Patient is in no acute distress.  Skin: Skin is warm and dry. No rash noted.   Cardiovascular: Normal heart rate noted  Respiratory: Normal respiratory effort, no problems with respiration noted  Abdomen: Soft, gravid, appropriate for gestational age.  Pain/Pressure: Present     Pelvic: Cervical exam deferred        Extremities: Normal range of motion.  Edema: None  Mental Status: Normal mood and affect. Normal behavior. Normal judgment and thought content.   Assessment and Plan:  Pregnancy: A5W0981 at [redacted]w[redacted]d  1. Supervision of other normal pregnancy, antepartum - Routine care - Needs to schedule with DM educator   Preterm labor symptoms and general obstetric precautions including but not limited to vaginal bleeding, contractions, leaking of fluid and fetal  movement were reviewed in detail with the patient. Please refer to After Visit Summary for other counseling recommendations.  Return in about 2 weeks (around 11/22/2017).  Future Appointments  Date Time Provider Department Center  11/09/2017 11:00 AM [facility]EDUCATION [facility]  11/22/2017  9:15 AM Levie Heritage, DO [facility]    Thressa Sheller, CNM

## 2017-11-08 NOTE — Progress Notes (Signed)
Headaches past week. Nausea, been drinking more water.

## 2017-11-09 ENCOUNTER — Encounter: Payer: Medicaid Other | Attending: Obstetrics & Gynecology | Admitting: *Deleted

## 2017-11-09 ENCOUNTER — Other Ambulatory Visit: Payer: Self-pay

## 2017-11-09 ENCOUNTER — Ambulatory Visit: Payer: Medicaid Other | Admitting: *Deleted

## 2017-11-09 DIAGNOSIS — R7302 Impaired glucose tolerance (oral): Secondary | ICD-10-CM

## 2017-11-09 DIAGNOSIS — Z713 Dietary counseling and surveillance: Secondary | ICD-10-CM | POA: Diagnosis not present

## 2017-11-09 DIAGNOSIS — O24419 Gestational diabetes mellitus in pregnancy, unspecified control: Secondary | ICD-10-CM

## 2017-11-09 MED ORDER — GLUCOSE BLOOD VI STRP: ORAL_STRIP | 12 refills | Status: DC

## 2017-11-09 MED ORDER — ACCU-CHEK FASTCLIX LANCETS MISC: 1.0000 | Freq: Four times a day (QID) | 12 refills | Status: DC

## 2017-11-09 MED ORDER — ACCU-CHEK GUIDE W/DEVICE KIT: 1.0000 | PACK | Freq: Four times a day (QID) | 0 refills | Status: DC

## 2017-11-09 NOTE — Progress Notes (Signed)
  Patient was seen on 11/09/2017 for Gestational Diabetes self-management. EDD 01/18/2018. Patient states no history of GDM. Diet history obtained. Patient eats fair variety of all food groups. Beverages include water, with occasional sweet tea and regular soda.  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 The following learning objectives were met by the patient :   States the definition of Gestational Diabetes  States why dietary management is important in controlling blood glucose  Describes the effects of carbohydrates on blood glucose levels  Demonstrates ability to create a balanced meal plan  Demonstrates carbohydrate counting   States when to check blood glucose levels  Demonstrates proper blood glucose monitoring techniques  States the effect of stress and exercise on blood glucose levels  States the importance of limiting caffeine and abstaining from alcohol and smoking  Plan:  Aim for 3 Carb Choices per meal (45 grams) +/- 1 either way  Aim for 1-2 Carbs per snack Begin reading food labels for Total Carbohydrate of foods If OK with your MD, consider  increasing your activity level by walking, Arm Chair Exercises or other activity daily as tolerated Begin checking BG before breakfast and 2 hours after first bite of breakfast, lunch and dinner as directed by MD  Bring Log Book/Sheet to every medical appointment   Baby Scripts:  Patient was introduced to [employer] and plans to use as record of BG electronically  Take medication if directed by MD  Blood glucose monitor Rx called into pharmacy: Accu Check Guide with Fast Clix drums Patient instructed to test pre breakfast and 2 hours each meal as directed by MD  Patient instructed to monitor glucose levels: FBS: 60 - 95 mg/dl 2 hour: <120 mg/dl  Patient received the following handouts:  Nutrition Diabetes and Pregnancy  Carbohydrate Counting List  Patient will be seen for follow-up as  needed.

## 2017-11-22 ENCOUNTER — Ambulatory Visit (INDEPENDENT_AMBULATORY_CARE_PROVIDER_SITE_OTHER): Payer: Medicaid Other | Admitting: Family Medicine

## 2017-11-22 VITALS — BP 122/73 | HR 85 | Wt 205.3 lb

## 2017-11-22 DIAGNOSIS — Z3480 Encounter for supervision of other normal pregnancy, unspecified trimester: Secondary | ICD-10-CM

## 2017-11-22 DIAGNOSIS — O24419 Gestational diabetes mellitus in pregnancy, unspecified control: Secondary | ICD-10-CM

## 2017-11-22 MED ORDER — INSULIN NPH (HUMAN) (ISOPHANE) 100 UNIT/ML ~~LOC~~ SUSP: 10.0000 [IU] | Freq: Every day | SUBCUTANEOUS | 3 refills | Status: DC

## 2017-11-22 MED ORDER — "INSULIN SYRINGE 31G X 5/16"" 1 ML MISC": 1.0000 | Freq: Four times a day (QID) | 0 refills | Status: DC

## 2017-11-22 NOTE — Patient Instructions (Signed)

## 2017-11-22 NOTE — Progress Notes (Signed)
Subjective:  Gail Garcia is a 32 y.o. [phone] at [redacted]w[redacted]d being seen today for ongoing prenatal care.  She is currently monitored for the following issues for this high-risk pregnancy and has Supervision of other normal pregnancy, antepartum; Asthma affecting pregnancy in second trimester; HPV in female; Chlamydia trachomatis infection in pregnancy in second trimester; Low-lying placenta in second trimester; and Gestational diabetes on their problem list.  GDM: Patient diet controlled.  Reports no hypoglycemic episodes.  Has only checked blood sugar 5 times since 10/23.  Fasting: 91-110 (3 of 4 values high) 2hr PP: only 1 value at 94  Patient reports no complaints.  Contractions: Not present. Vag. Bleeding: None.  Movement: Present. Denies leaking of fluid.   The following portions of the patient's history were reviewed and updated as appropriate: allergies, current medications, past family history, past medical history, past social history, past surgical history and problem list. Problem list updated.  Objective:   Vitals:   11/22/17 0949  BP: 122/73  Pulse: 85  Weight: 205 lb 4.8 oz (93.1 kg)    Fetal Status: Fetal Heart Rate (bpm): 145   Movement: Present     General:  Alert, oriented and cooperative. Patient is in no acute distress.  Skin: Skin is warm and dry. No rash noted.   Cardiovascular: Normal heart rate noted  Respiratory: Normal respiratory effort, no problems with respiration noted  Abdomen: Soft, gravid, appropriate for gestational age. Pain/Pressure: Absent     Pelvic: Vag. Bleeding: None     Cervical exam deferred        Extremities: Normal range of motion.  Edema: None  Mental Status: Normal mood and affect. Normal behavior. Normal judgment and thought content.   Urinalysis:      Assessment and Plan:  Pregnancy: A5W0981 at [redacted]w[redacted]d  1. Supervision of other normal pregnancy, antepartum FHT and FH normal  2. Gestational diabetes mellitus (GDM) in third trimester,  gestational diabetes method of control unspecified Start NPH 10 units at night. Counseled patient on compliance of CBG checking. Start antenatal testing next visit.  Preterm labor symptoms and general obstetric precautions including but not limited to vaginal bleeding, contractions, leaking of fluid and fetal movement were reviewed in detail with the patient. Please refer to After Visit Summary for other counseling recommendations.  No follow-ups on file.   Levie Heritage, DO

## 2017-12-01 ENCOUNTER — Ambulatory Visit: Payer: Self-pay

## 2017-12-01 ENCOUNTER — Ambulatory Visit (INDEPENDENT_AMBULATORY_CARE_PROVIDER_SITE_OTHER): Payer: Medicaid Other | Admitting: *Deleted

## 2017-12-01 ENCOUNTER — Ambulatory Visit (INDEPENDENT_AMBULATORY_CARE_PROVIDER_SITE_OTHER): Payer: Medicaid Other | Admitting: Family Medicine

## 2017-12-01 VITALS — BP 137/63 | HR 103 | Wt 205.8 lb

## 2017-12-01 DIAGNOSIS — O24414 Gestational diabetes mellitus in pregnancy, insulin controlled: Secondary | ICD-10-CM

## 2017-12-01 DIAGNOSIS — Z3480 Encounter for supervision of other normal pregnancy, unspecified trimester: Secondary | ICD-10-CM

## 2017-12-01 DIAGNOSIS — O163 Unspecified maternal hypertension, third trimester: Secondary | ICD-10-CM

## 2017-12-01 DIAGNOSIS — Z3483 Encounter for supervision of other normal pregnancy, third trimester: Secondary | ICD-10-CM

## 2017-12-01 LAB — POCT URINALYSIS DIP (DEVICE)
BILIRUBIN URINE: NEGATIVE
GLUCOSE, UA: NEGATIVE mg/dL
Ketones, ur: NEGATIVE mg/dL
LEUKOCYTES UA: NEGATIVE
Nitrite: NEGATIVE
PROTEIN: NEGATIVE mg/dL
Specific Gravity, Urine: 1.02 (ref 1.005–1.030)
Urobilinogen, UA: 0.2 mg/dL (ref 0.0–1.0)
pH: 7 (ref 5.0–8.0)

## 2017-12-01 MED ORDER — INSULIN NPH (HUMAN) (ISOPHANE) 100 UNIT/ML ~~LOC~~ SUSP: 12.0000 [IU] | Freq: Every day | SUBCUTANEOUS | 3 refills | Status: DC

## 2017-12-01 NOTE — Progress Notes (Signed)
Rcvd Baby Scripts Alert for this Pt:

## 2017-12-01 NOTE — Patient Instructions (Signed)

## 2017-12-01 NOTE — Progress Notes (Signed)
   PRENATAL VISIT NOTE  Subjective:  Gail Garcia is a 32 y.o. [phone] at 4243w1d being seen today for ongoing prenatal care.  She is currently monitored for the following issues for this high-risk pregnancy and has Pregnancy; Supervision of other normal pregnancy, antepartum; Asthma affecting pregnancy in second trimester; HPV in female; Chlamydia trachomatis infection in pregnancy in second trimester; Low-lying placenta in second trimester; and Gestational diabetes on their problem list.  Patient reports headache.  Contractions: Irregular. Vag. Bleeding: None.  Movement: Present. Denies leaking of fluid.   The following portions of the patient's history were reviewed and updated as appropriate: allergies, current medications, past family history, past medical history, past social history, past surgical history and problem list. Problem list updated.  Objective:   Vitals:   12/01/17 1020 12/01/17 1055  BP: (!) 143/67 137/63  Pulse: (!) 103   Weight: 205 lb 12.8 oz (93.4 kg)     Fetal Status: Fetal Heart Rate (bpm): NST   Movement: Present     General:  Alert, oriented and cooperative. Patient is in no acute distress.  Skin: Skin is warm and dry. No rash noted.   Cardiovascular: Normal heart rate noted  Respiratory: Normal respiratory effort, no problems with respiration noted  Abdomen: Soft, gravid, appropriate for gestational age.  Pain/Pressure: Absent     Pelvic: Cervical exam deferred        Extremities: Normal range of motion.     Mental Status: Normal mood and affect. Normal behavior. Normal judgment and thought content.  NST:  Baseline: 120 bpm, Variability: Good {> 6 bpm), Accelerations: Reactive and Decelerations: Absent   Assessment and Plan:  Pregnancy: A5W0981G4P2012 at 6443w1d  1. Supervision of other normal pregnancy, antepartum   2. Insulin controlled gestational diabetes mellitus (GDM) in third trimester Began insulin on 11/6--has 4 days of values FBS 86-113 2 are in  range, 2 are out 2 hour pp 101-111 - US MFM OB FOLLOW UP; Future - US MFM FETAL BPP WO NON STRESS; Future - insulin NPH Human (HUMULIN N,NOVOLIN N) 100 UNIT/ML injection; Inject 0.12 mLs (12 Units total) into the skin at bedtime.  Dispense: 10 mL; Refill: 3  3. Elevated blood pressure affecting pregnancy in third trimester, antepartum Given headache, vision changes, check labs. Repeat BP ok--Preeclampsia warning signs are reviewed - CBC - Comprehensive metabolic panel - Protein / creatinine ratio, urine  Preterm labor symptoms and general obstetric precautions including but not limited to vaginal bleeding, contractions, leaking of fluid and fetal movement were reviewed in detail with the patient. Please refer to After Visit Summary for other counseling recommendations.  Return in about 5 days (around 12/06/2017) for NST only @ 1315 (double book ok); 2 weeks NST/BPP and HOB.  Future Appointments  Date Time Provider Department Center  12/06/2017 11:15 AM [facility] NST [facility]  12/06/2017  2:30 PM [facility] US 1 [facility]  12/15/2017 10:15 AM [facility] NST [facility]  12/15/2017 11:15 AM Constant, Gigi GinPeggy, MD [facility]    Reva Boresanya S Pratt, MD

## 2017-12-01 NOTE — Progress Notes (Signed)
Pt informed that the ultrasound is considered a limited OB ultrasound and is not intended to be a complete ultrasound exam.  Patient also informed that the ultrasound is not being completed with the intent of assessing for fetal or placental anomalies or any pelvic abnormalities.  Explained that the purpose of today's ultrasound is to assess for presentation, BPP and amniotic fluid volume.  Patient acknowledges the purpose of the exam and the limitations of the study.  Pt denies H/A or visual disturbances today however states she has been having H/A's and seeing spots frequently.    Ernestene Coover, [facility]  12/01/17

## 2017-12-01 NOTE — Progress Notes (Signed)
Patient has appointment later today with Dr Shawnie PonsPratt.

## 2017-12-02 LAB — COMPREHENSIVE METABOLIC PANEL
ALBUMIN: 3.6 g/dL (ref 3.5–5.5)
ALK PHOS: 136 IU/L — AB (ref 39–117)
ALT: 10 IU/L (ref 0–32)
AST: 10 IU/L (ref 0–40)
Albumin/Globulin Ratio: 1.4 (ref 1.2–2.2)
BUN / CREAT RATIO: 14 (ref 9–23)
BUN: 6 mg/dL (ref 6–20)
Bilirubin Total: <0.2 mg/dL (ref 0.0–1.2)
CO2: 18 mmol/L — ABNORMAL LOW (ref 20–29)
CREATININE: 0.43 mg/dL — AB (ref 0.57–1.00)
Calcium: 9 mg/dL (ref 8.7–10.2)
Chloride: 104 mmol/L (ref 96–106)
GFR calc Af Amer: 158 mL/min/{1.73_m2} (ref >59)
GFR calc non Af Amer: 137 mL/min/{1.73_m2} (ref >59)
Globulin, Total: 2.6 g/dL (ref 1.5–4.5)
Glucose: 76 mg/dL (ref 65–99)
Potassium: 3.4 mmol/L — ABNORMAL LOW (ref 3.5–5.2)
Sodium: 138 mmol/L (ref 134–144)
Total Protein: 6.2 g/dL (ref 6.0–8.5)

## 2017-12-02 LAB — CBC
HEMOGLOBIN: 9.3 g/dL — AB (ref 11.1–15.9)
Hematocrit: 27.9 % — ABNORMAL LOW (ref 34.0–46.6)
MCH: 28.8 pg (ref 26.6–33.0)
MCHC: 33.3 g/dL (ref 31.5–35.7)
MCV: 86 fL (ref 79–97)
Platelets: 259 10*3/uL (ref 150–450)
RBC: 3.23 x10E6/uL — AB (ref 3.77–5.28)
RDW: 13.9 % (ref 12.3–15.4)
WBC: 9.5 10*3/uL (ref 3.4–10.8)

## 2017-12-02 LAB — PROTEIN / CREATININE RATIO, URINE
CREATININE, UR: 96.1 mg/dL
PROTEIN UR: 24.3 mg/dL
PROTEIN/CREAT RATIO: 253 mg/g{creat} — AB (ref 0–200)

## 2017-12-06 ENCOUNTER — Ambulatory Visit: Payer: Medicaid Other | Admitting: *Deleted

## 2017-12-06 ENCOUNTER — Ambulatory Visit (HOSPITAL_COMMUNITY)
Admission: RE | Admit: 2017-12-06 | Discharge: 2017-12-06 | Disposition: A | Discharge status: Home / Self Care | Payer: Medicaid Other | Source: Ambulatory Visit | Attending: Family Medicine | Admitting: Family Medicine

## 2017-12-06 VITALS — BP 122/61 | HR 94 | Wt 207.7 lb

## 2017-12-06 DIAGNOSIS — O24414 Gestational diabetes mellitus in pregnancy, insulin controlled: Secondary | ICD-10-CM | POA: Insufficient documentation

## 2017-12-06 DIAGNOSIS — Z3A33 33 weeks gestation of pregnancy: Secondary | ICD-10-CM | POA: Diagnosis not present

## 2017-12-06 DIAGNOSIS — Z362 Encounter for other antenatal screening follow-up: Secondary | ICD-10-CM | POA: Insufficient documentation

## 2017-12-06 DIAGNOSIS — O163 Unspecified maternal hypertension, third trimester: Secondary | ICD-10-CM

## 2017-12-06 DIAGNOSIS — O99213 Obesity complicating pregnancy, third trimester: Secondary | ICD-10-CM

## 2017-12-06 NOTE — Progress Notes (Signed)
Pt denies H/A or visual disturbances. She has US for growth and BPP today @ 1430

## 2017-12-07 ENCOUNTER — Other Ambulatory Visit (HOSPITAL_COMMUNITY): Payer: Self-pay | Admitting: *Deleted

## 2017-12-07 DIAGNOSIS — O24414 Gestational diabetes mellitus in pregnancy, insulin controlled: Secondary | ICD-10-CM

## 2017-12-15 ENCOUNTER — Ambulatory Visit (INDEPENDENT_AMBULATORY_CARE_PROVIDER_SITE_OTHER): Payer: Medicaid Other | Admitting: Obstetrics and Gynecology

## 2017-12-15 ENCOUNTER — Ambulatory Visit: Payer: Self-pay

## 2017-12-15 ENCOUNTER — Encounter: Payer: Self-pay | Admitting: Obstetrics and Gynecology

## 2017-12-15 ENCOUNTER — Ambulatory Visit (INDEPENDENT_AMBULATORY_CARE_PROVIDER_SITE_OTHER): Payer: Medicaid Other | Admitting: General Practice

## 2017-12-15 VITALS — BP 129/57 | HR 94 | Wt 206.0 lb

## 2017-12-15 DIAGNOSIS — Z3483 Encounter for supervision of other normal pregnancy, third trimester: Secondary | ICD-10-CM

## 2017-12-15 DIAGNOSIS — O24414 Gestational diabetes mellitus in pregnancy, insulin controlled: Secondary | ICD-10-CM

## 2017-12-15 DIAGNOSIS — O24419 Gestational diabetes mellitus in pregnancy, unspecified control: Secondary | ICD-10-CM

## 2017-12-15 DIAGNOSIS — O0990 Supervision of high risk pregnancy, unspecified, unspecified trimester: Secondary | ICD-10-CM

## 2017-12-15 DIAGNOSIS — Z3480 Encounter for supervision of other normal pregnancy, unspecified trimester: Secondary | ICD-10-CM

## 2017-12-15 DIAGNOSIS — Z3A35 35 weeks gestation of pregnancy: Secondary | ICD-10-CM

## 2017-12-15 NOTE — Progress Notes (Signed)
   PRENATAL VISIT NOTE  Subjective:  Gail Garcia is a 32 y.o. [phone] at 4265w1d being seen today for ongoing prenatal care.  She is currently monitored for the following issues for this high-risk pregnancy and has Pregnancy; Supervision of high risk pregnancy, antepartum; Asthma affecting pregnancy in second trimester; HPV in female; Chlamydia trachomatis infection in pregnancy in second trimester; Low-lying placenta in second trimester; and Gestational diabetes on their problem list.  Patient reports no complaints.  Contractions: Not present. Vag. Bleeding: None.  Movement: Present. Denies leaking of fluid.   The following portions of the patient's history were reviewed and updated as appropriate: allergies, current medications, past family history, past medical history, past social history, past surgical history and problem list. Problem list updated.  Objective:   Vitals:   12/15/17 1049  BP: (!) 129/57  Pulse: 94  Weight: 206 lb (93.4 kg)    Fetal Status: Fetal Heart Rate (bpm): NST   Movement: Present     General:  Alert, oriented and cooperative. Patient is in no acute distress.  Skin: Skin is warm and dry. No rash noted.   Cardiovascular: Normal heart rate noted  Respiratory: Normal respiratory effort, no problems with respiration noted  Abdomen: Soft, gravid, appropriate for gestational age.  Pain/Pressure: Absent     Pelvic: Cervical exam deferred        Extremities: Normal range of motion.  Edema: None  Mental Status: Normal mood and affect. Normal behavior. Normal judgment and thought content.   Assessment and Plan:  Pregnancy: [id number] at 1365w1d  1. Supervision of other normal pregnancy, antepartum Patient is doing well without complaints Cultures next visit  2. Gestational diabetes mellitus (GDM) in third trimester, gestational diabetes method of control unspecified CBGs reviewed and all within range Continue insulin at current dosage Continue antenatal  testing Follow up growth ultrasound scheduled  Preterm labor symptoms and general obstetric precautions including but not limited to vaginal bleeding, contractions, leaking of fluid and fetal movement were reviewed in detail with the patient. Please refer to After Visit Summary for other counseling recommendations.  Return in about 1 week (around 12/22/2017) for ROB.  Future Appointments  Date Time Provider Department Center  12/15/2017 11:35 AM [facility] IMAGING [facility] WOC  12/22/2017  3:15 PM [facility] NST [facility] WOC  12/22/2017  4:15 PM Allie Bossierove, Myra C, MD [facility] WOC  01/04/2018  1:45 PM WH-MFC US 2 WH-MFCUS MFC-US    Catalina AntiguaPeggy Damiyah Ditmars, MD

## 2017-12-15 NOTE — Progress Notes (Signed)
Pt informed that the ultrasound is considered a limited OB ultrasound and is not intended to be a complete ultrasound exam.  Patient also informed that the ultrasound is not being completed with the intent of assessing for fetal or placental anomalies or any pelvic abnormalities.  Explained that the purpose of today's ultrasound is to assess for  BPP, presentation and AFI.  Patient acknowledges the purpose of the exam and the limitations of the study.    Carrie H RN BSN 12/15/17  

## 2017-12-22 ENCOUNTER — Other Ambulatory Visit (HOSPITAL_COMMUNITY)
Admission: RE | Admit: 2017-12-22 | Discharge: 2017-12-22 | Disposition: A | Discharge status: Home / Self Care | Payer: Medicaid Other | Source: Ambulatory Visit | Attending: Obstetrics & Gynecology | Admitting: Obstetrics & Gynecology

## 2017-12-22 ENCOUNTER — Ambulatory Visit (INDEPENDENT_AMBULATORY_CARE_PROVIDER_SITE_OTHER): Payer: Medicaid Other | Admitting: Obstetrics & Gynecology

## 2017-12-22 ENCOUNTER — Ambulatory Visit: Payer: Self-pay

## 2017-12-22 ENCOUNTER — Ambulatory Visit (INDEPENDENT_AMBULATORY_CARE_PROVIDER_SITE_OTHER): Payer: Medicaid Other | Admitting: General Practice

## 2017-12-22 VITALS — BP 140/64 | HR 107 | Wt 205.0 lb

## 2017-12-22 DIAGNOSIS — O0993 Supervision of high risk pregnancy, unspecified, third trimester: Secondary | ICD-10-CM

## 2017-12-22 DIAGNOSIS — O0990 Supervision of high risk pregnancy, unspecified, unspecified trimester: Secondary | ICD-10-CM

## 2017-12-22 DIAGNOSIS — O24414 Gestational diabetes mellitus in pregnancy, insulin controlled: Secondary | ICD-10-CM | POA: Diagnosis not present

## 2017-12-22 DIAGNOSIS — Z3A36 36 weeks gestation of pregnancy: Secondary | ICD-10-CM

## 2017-12-22 LAB — OB RESULTS CONSOLE GBS: GBS: NEGATIVE

## 2017-12-22 NOTE — Progress Notes (Signed)
Pt informed that the ultrasound is considered a limited OB ultrasound and is not intended to be a complete ultrasound exam.  Patient also informed that the ultrasound is not being completed with the intent of assessing for fetal or placental anomalies or any pelvic abnormalities.  Explained that the purpose of today's ultrasound is to assess for  BPP, presentation and AFI.  Patient acknowledges the purpose of the exam and the limitations of the study.    Karie Skowron H RN BSN 12/22/17  

## 2017-12-22 NOTE — Progress Notes (Signed)
   PRENATAL VISIT NOTE  Subjective:  Gail Garcia is a 32 y.o. [phone] at 1795w1d being seen today for ongoing prenatal care.  She is currently monitored for the following issues for this high-risk pregnancy and has Pregnancy; Supervision of high risk pregnancy, antepartum; Asthma affecting pregnancy in second trimester; HPV in female; Chlamydia trachomatis infection in pregnancy in second trimester; Low-lying placenta in second trimester; and Gestational diabetes on their problem list.  Patient reports no complaints.  Contractions: Irregular. Vag. Bleeding: None.  Movement: Present. Denies leaking of fluid.   The following portions of the patient's history were reviewed and updated as appropriate: allergies, current medications, past family history, past medical history, past social history, past surgical history and problem list. Problem list updated.  Objective:   Vitals:   12/22/17 1536  BP: 140/64  Pulse: (!) 107  Weight: 205 lb (93 kg)    Fetal Status: Fetal Heart Rate (bpm): NST   Movement: Present     General:  Alert, oriented and cooperative. Patient is in no acute distress.  Skin: Skin is warm and dry. No rash noted.   Cardiovascular: Normal heart rate noted  Respiratory: Normal respiratory effort, no problems with respiration noted  Abdomen: Soft, gravid, appropriate for gestational age.  Pain/Pressure: Present     Pelvic: Cervical exam performed        Extremities: Normal range of motion.  Edema: None  Mental Status: Normal mood and affect. Normal behavior. Normal judgment and thought content.   Assessment and Plan:  Pregnancy: [id number] at 3695w1d  1. Supervision of high risk pregnancy, antepartum  - Cervicovaginal ancillary only( [place]) - Culture, beta strep (group b only)  2. Insulin controlled gestational diabetes mellitus (GDM) in third trimester -weekly BPP - she hasn't checked her sugars today, didn't bring her sugars, recalls that her highest value last  week was 98  Preterm labor symptoms and general obstetric precautions including but not limited to vaginal bleeding, contractions, leaking of fluid and fetal movement were reviewed in detail with the patient. Please refer to After Visit Summary for other counseling recommendations.  Return in about 1 week (around 12/29/2017) for NST.  Future Appointments  Date Time Provider Department Center  12/22/2017  4:35 PM [facility] IMAGING [facility] WOC  12/29/2017  3:15 PM [facility] NST [facility] WOC  12/29/2017  4:15 PM Constant, Gigi GinPeggy, MD [facility] WOC  01/04/2018  1:45 PM WH-MFC US 2 WH-MFCUS MFC-US  01/05/2018  3:15 PM WOC-[facility]A NST W[facility]-W[facility]A WOC  01/05/2018  4:15 PM Constant, Gigi GinPeggy, MD [facility] WOC    Allie BossierMyra C Dray Dente, MD

## 2017-12-23 LAB — CERVICOVAGINAL ANCILLARY ONLY
CHLAMYDIA, DNA PROBE: NEGATIVE
Neisseria Gonorrhea: NEGATIVE

## 2017-12-26 LAB — CULTURE, BETA STREP (GROUP B ONLY): Strep Gp B Culture: NEGATIVE

## 2017-12-29 ENCOUNTER — Ambulatory Visit: Payer: Self-pay

## 2017-12-29 ENCOUNTER — Encounter: Payer: Self-pay | Admitting: Obstetrics and Gynecology

## 2017-12-29 ENCOUNTER — Ambulatory Visit (INDEPENDENT_AMBULATORY_CARE_PROVIDER_SITE_OTHER): Payer: Medicaid Other | Admitting: Obstetrics and Gynecology

## 2017-12-29 ENCOUNTER — Ambulatory Visit (INDEPENDENT_AMBULATORY_CARE_PROVIDER_SITE_OTHER): Payer: Medicaid Other | Admitting: General Practice

## 2017-12-29 VITALS — BP 135/62 | HR 103 | Wt 208.0 lb

## 2017-12-29 DIAGNOSIS — O0993 Supervision of high risk pregnancy, unspecified, third trimester: Secondary | ICD-10-CM

## 2017-12-29 DIAGNOSIS — O0990 Supervision of high risk pregnancy, unspecified, unspecified trimester: Secondary | ICD-10-CM

## 2017-12-29 DIAGNOSIS — O24414 Gestational diabetes mellitus in pregnancy, insulin controlled: Secondary | ICD-10-CM | POA: Diagnosis not present

## 2017-12-29 LAB — POCT URINALYSIS DIP (DEVICE)
BILIRUBIN URINE: NEGATIVE
Glucose, UA: NEGATIVE mg/dL
KETONES UR: NEGATIVE mg/dL
Nitrite: NEGATIVE
PH: 7 (ref 5.0–8.0)
Protein, ur: NEGATIVE mg/dL
Specific Gravity, Urine: 1.02 (ref 1.005–1.030)
UROBILINOGEN UA: 0.2 mg/dL (ref 0.0–1.0)

## 2017-12-29 NOTE — Progress Notes (Signed)
   PRENATAL VISIT NOTE  Subjective:  Gail Garcia is a 32 y.o. [phone] at 1364w1d being seen today for ongoing prenatal care.  She is currently monitored for the following issues for this high-risk pregnancy and has Supervision of high risk pregnancy, antepartum; Asthma affecting pregnancy in second trimester; HPV in female; Chlamydia trachomatis infection in pregnancy in second trimester; Low-lying placenta in second trimester; and Gestational diabetes on their problem list.  Patient reports no complaints.  Contractions: Irritability. Vag. Bleeding: None.  Movement: Present. Denies leaking of fluid.   The following portions of the patient's history were reviewed and updated as appropriate: allergies, current medications, past family history, past medical history, past social history, past surgical history and problem list. Problem list updated.  Objective:   Vitals:   12/29/17 1529  BP: 135/62  Pulse: (!) 103  Weight: 208 lb (94.3 kg)    Fetal Status: Fetal Heart Rate (bpm): NST   Movement: Present     General:  Alert, oriented and cooperative. Patient is in no acute distress.  Skin: Skin is warm and dry. No rash noted.   Cardiovascular: Normal heart rate noted  Respiratory: Normal respiratory effort, no problems with respiration noted  Abdomen: Soft, gravid, appropriate for gestational age.  Pain/Pressure: Absent     Pelvic: Cervical exam deferred        Extremities: Normal range of motion.  Edema: None  Mental Status: Normal mood and affect. Normal behavior. Normal judgment and thought content.   Assessment and Plan:  Pregnancy: [id number] at 4264w1d  1. Supervision of high risk pregnancy, antepartum Patient is doing well without complaints  2. Insulin controlled gestational diabetes mellitus (GDM) in third trimester CBGs great majority within range. Last few fasting elevated to 111. Patient admit to consuming left over baby shower cake overnight Continue insulin at current  dosage Advised patient to resume healthy eating BPP 10/10 Will schedule IOL at 39 weeks  Term labor symptoms and general obstetric precautions including but not limited to vaginal bleeding, contractions, leaking of fluid and fetal movement were reviewed in detail with the patient. Please refer to After Visit Summary for other counseling recommendations.  No follow-ups on file.  Future Appointments  Date Time Provider Department Center  12/29/2017  4:15 PM Baker Moronta, Gigi GinPeggy, MD [facility] WOC  12/29/2017  4:35 PM [facility] IMAGING [facility]G WOC  01/04/2018  1:45 PM WH-MFC US 2 WH-MFCUS MFC-US  01/05/2018  3:15 PM W[facility] NST [facility]A WOC  01/05/2018  4:15 PM Darric Plante, Gigi GinPeggy, MD [facility]    Catalina AntiguaPeggy Kadyn Chovan, MD

## 2017-12-29 NOTE — Progress Notes (Signed)
Pt informed that the ultrasound is considered a limited OB ultrasound and is not intended to be a complete ultrasound exam.  Patient also informed that the ultrasound is not being completed with the intent of assessing for fetal or placental anomalies or any pelvic abnormalities.  Explained that the purpose of today's ultrasound is to assess for  BPP, presentation and AFI.  Patient acknowledges the purpose of the exam and the limitations of the study.    Scheduled IOL 12/24 @ 7am  Chase Callerarrie H RN BSN 12/29/17

## 2017-12-29 NOTE — Progress Notes (Signed)
I have reviewed this chart and agree with the RN/CMA assessment and management.    Fajr Fife C Dez Stauffer, MD, FACOG Attending Physician, [facility]  

## 2018-01-03 ENCOUNTER — Telehealth (HOSPITAL_COMMUNITY): Payer: Self-pay | Admitting: *Deleted

## 2018-01-03 ENCOUNTER — Encounter (HOSPITAL_COMMUNITY): Payer: Self-pay | Admitting: *Deleted

## 2018-01-03 NOTE — Telephone Encounter (Signed)
Preadmission screen  

## 2018-01-04 ENCOUNTER — Other Ambulatory Visit: Payer: Self-pay | Admitting: *Deleted

## 2018-01-04 ENCOUNTER — Ambulatory Visit (HOSPITAL_COMMUNITY)
Admission: RE | Admit: 2018-01-04 | Discharge: 2018-01-04 | Disposition: A | Discharge status: Home / Self Care | Payer: Medicaid Other | Source: Ambulatory Visit | Attending: Family Medicine | Admitting: Family Medicine

## 2018-01-04 DIAGNOSIS — O24414 Gestational diabetes mellitus in pregnancy, insulin controlled: Secondary | ICD-10-CM | POA: Diagnosis present

## 2018-01-04 DIAGNOSIS — Z3A38 38 weeks gestation of pregnancy: Secondary | ICD-10-CM | POA: Diagnosis not present

## 2018-01-05 ENCOUNTER — Ambulatory Visit (INDEPENDENT_AMBULATORY_CARE_PROVIDER_SITE_OTHER): Payer: Medicaid Other | Admitting: *Deleted

## 2018-01-05 ENCOUNTER — Ambulatory Visit (INDEPENDENT_AMBULATORY_CARE_PROVIDER_SITE_OTHER): Payer: Medicaid Other | Admitting: Obstetrics and Gynecology

## 2018-01-05 ENCOUNTER — Encounter: Payer: Self-pay | Admitting: Obstetrics and Gynecology

## 2018-01-05 VITALS — BP 124/60 | HR 86 | Wt 207.5 lb

## 2018-01-05 DIAGNOSIS — O24414 Gestational diabetes mellitus in pregnancy, insulin controlled: Secondary | ICD-10-CM

## 2018-01-05 DIAGNOSIS — O0993 Supervision of high risk pregnancy, unspecified, third trimester: Secondary | ICD-10-CM

## 2018-01-05 DIAGNOSIS — O0990 Supervision of high risk pregnancy, unspecified, unspecified trimester: Secondary | ICD-10-CM

## 2018-01-05 NOTE — Progress Notes (Signed)
   PRENATAL VISIT NOTE  Subjective:  Gail Garcia is a 32 y.o. [phone] at 344w1d being seen today for ongoing prenatal care.  She is currently monitored for the following issues for this high-risk pregnancy and has Supervision of high risk pregnancy, antepartum; Asthma affecting pregnancy in second trimester; HPV in female; Chlamydia trachomatis infection in pregnancy in second trimester; Low-lying placenta in second trimester; and Gestational diabetes on their problem list.  Patient reports no complaints.  Contractions: Irregular. Vag. Bleeding: None.  Movement: Present. Denies leaking of fluid.   The following portions of the patient's history were reviewed and updated as appropriate: allergies, current medications, past family history, past medical history, past social history, past surgical history and problem list. Problem list updated.  Objective:   Vitals:   01/05/18 1528  BP: 124/60  Pulse: 86  Weight: 207 lb 8 oz (94.1 kg)    Fetal Status: Fetal Heart Rate (bpm): NST   Movement: Present     General:  Alert, oriented and cooperative. Patient is in no acute distress.  Skin: Skin is warm and dry. No rash noted.   Cardiovascular: Normal heart rate noted  Respiratory: Normal respiratory effort, no problems with respiration noted  Abdomen: Soft, gravid, appropriate for gestational age.  Pain/Pressure: Absent     Pelvic: Cervical exam deferred        Extremities: Normal range of motion.     Mental Status: Normal mood and affect. Normal behavior. Normal judgment and thought content.   Assessment and Plan:  Pregnancy: [id number] at 7944w1d  1. Supervision of high risk pregnancy, antepartum Patient is doing well without complaints  2. Insulin controlled gestational diabetes mellitus (GDM) in third trimester CBGs reviewed and all within range Patient scheduled for IOL on 12/24 Normal growth and BPP on 12/17 NST reviewed and reactive  Term labor symptoms and general obstetric  precautions including but not limited to vaginal bleeding, contractions, leaking of fluid and fetal movement were reviewed in detail with the patient. Please refer to After Visit Summary for other counseling recommendations.  Return for IOL on 12/24.  Future Appointments  Date Time Provider Department Center  01/05/2018  4:15 PM Ia Leeb, Gigi GinPeggy, MD WOC-WOCA WOC  01/11/2018  7:00 AM WH-BSSCHED ROOM WH-BSSCHED None    Catalina AntiguaPeggy Marquasha Brutus, MD

## 2018-01-05 NOTE — Progress Notes (Signed)
US for growth and BPP done yesterday,  IOL scheduled 12/24 @ 0700.

## 2018-01-11 ENCOUNTER — Inpatient Hospital Stay (HOSPITAL_COMMUNITY): Payer: Medicaid Other | Admitting: Anesthesiology

## 2018-01-11 ENCOUNTER — Encounter (HOSPITAL_COMMUNITY): Payer: Self-pay

## 2018-01-11 ENCOUNTER — Inpatient Hospital Stay (HOSPITAL_COMMUNITY)
Admission: RE | Admit: 2018-01-11 | Discharge: 2018-01-13 | DRG: 806 | Disposition: A | Discharge status: Home / Self Care | Payer: Medicaid Other | Attending: Obstetrics and Gynecology | Admitting: Obstetrics and Gynecology

## 2018-01-11 DIAGNOSIS — O9952 Diseases of the respiratory system complicating childbirth: Secondary | ICD-10-CM | POA: Diagnosis present

## 2018-01-11 DIAGNOSIS — J45909 Unspecified asthma, uncomplicated: Secondary | ICD-10-CM | POA: Diagnosis present

## 2018-01-11 DIAGNOSIS — Z3490 Encounter for supervision of normal pregnancy, unspecified, unspecified trimester: Secondary | ICD-10-CM | POA: Diagnosis present

## 2018-01-11 DIAGNOSIS — Z3A39 39 weeks gestation of pregnancy: Secondary | ICD-10-CM | POA: Diagnosis not present

## 2018-01-11 DIAGNOSIS — D509 Iron deficiency anemia, unspecified: Secondary | ICD-10-CM | POA: Diagnosis present

## 2018-01-11 DIAGNOSIS — O9902 Anemia complicating childbirth: Secondary | ICD-10-CM | POA: Diagnosis present

## 2018-01-11 DIAGNOSIS — O4443 Low lying placenta NOS or without hemorrhage, third trimester: Secondary | ICD-10-CM | POA: Diagnosis present

## 2018-01-11 DIAGNOSIS — Z87891 Personal history of nicotine dependence: Secondary | ICD-10-CM | POA: Diagnosis not present

## 2018-01-11 DIAGNOSIS — O24424 Gestational diabetes mellitus in childbirth, insulin controlled: Secondary | ICD-10-CM | POA: Diagnosis present

## 2018-01-11 DIAGNOSIS — O24419 Gestational diabetes mellitus in pregnancy, unspecified control: Secondary | ICD-10-CM | POA: Diagnosis present

## 2018-01-11 DIAGNOSIS — O4442 Low lying placenta NOS or without hemorrhage, second trimester: Secondary | ICD-10-CM | POA: Diagnosis present

## 2018-01-11 DIAGNOSIS — O99512 Diseases of the respiratory system complicating pregnancy, second trimester: Secondary | ICD-10-CM

## 2018-01-11 LAB — CBC
HCT: 29.9 % — ABNORMAL LOW (ref 36.0–46.0)
Hemoglobin: 9.9 g/dL — ABNORMAL LOW (ref 12.0–15.0)
MCH: 28.8 pg (ref 26.0–34.0)
MCHC: 33.1 g/dL (ref 30.0–36.0)
MCV: 86.9 fL (ref 80.0–100.0)
PLATELETS: 301 10*3/uL (ref 150–400)
RBC: 3.44 MIL/uL — ABNORMAL LOW (ref 3.87–5.11)
RDW: 14.6 % (ref 11.5–15.5)
WBC: 10.6 10*3/uL — ABNORMAL HIGH (ref 4.0–10.5)
nRBC: 0 % (ref 0.0–0.2)

## 2018-01-11 LAB — GLUCOSE, CAPILLARY
Glucose-Capillary: 122 mg/dL — ABNORMAL HIGH (ref 70–99)
Glucose-Capillary: 91 mg/dL (ref 70–99)
Glucose-Capillary: 88 mg/dL (ref 70–99)

## 2018-01-11 LAB — TYPE AND SCREEN
ABO/RH(D): O POS
Antibody Screen: NEGATIVE

## 2018-01-11 LAB — RPR: RPR Ser Ql: NONREACTIVE

## 2018-01-11 MED ORDER — ONDANSETRON HCL 4 MG PO TABS: 4.0000 mg | ORAL_TABLET | ORAL | Status: DC | PRN

## 2018-01-11 MED ORDER — EPHEDRINE 5 MG/ML INJ
10.0000 mg | INTRAVENOUS | Status: DC | PRN
  Filled 2018-01-11: qty 2

## 2018-01-11 MED ORDER — OXYTOCIN BOLUS FROM INFUSION
500.0000 mL | Freq: Once | INTRAVENOUS | Status: AC
  Administered 2018-01-11: 500 mL via INTRAVENOUS

## 2018-01-11 MED ORDER — LIDOCAINE HCL (PF) 1 % IJ SOLN
30.0000 mL | INTRAMUSCULAR | Status: DC | PRN
  Filled 2018-01-11: qty 30

## 2018-01-11 MED ORDER — BENZOCAINE-MENTHOL 20-0.5 % EX AERO: 1.0000 "application " | INHALATION_SPRAY | CUTANEOUS | Status: DC | PRN

## 2018-01-11 MED ORDER — PHENYLEPHRINE 40 MCG/ML (10ML) SYRINGE FOR IV PUSH (FOR BLOOD PRESSURE SUPPORT)
80.0000 ug | PREFILLED_SYRINGE | INTRAVENOUS | Status: DC | PRN
  Filled 2018-01-11: qty 10

## 2018-01-11 MED ORDER — ACETAMINOPHEN 325 MG PO TABS: 650.0000 mg | ORAL_TABLET | ORAL | Status: DC | PRN

## 2018-01-11 MED ORDER — ZOLPIDEM TARTRATE 5 MG PO TABS: 5.0000 mg | ORAL_TABLET | Freq: Every evening | ORAL | Status: DC | PRN

## 2018-01-11 MED ORDER — DIPHENHYDRAMINE HCL 25 MG PO CAPS: 25.0000 mg | ORAL_CAPSULE | Freq: Four times a day (QID) | ORAL | Status: DC | PRN

## 2018-01-11 MED ORDER — ONDANSETRON HCL 4 MG/2ML IJ SOLN: 4.0000 mg | Freq: Four times a day (QID) | INTRAMUSCULAR | Status: DC | PRN

## 2018-01-11 MED ORDER — MISOPROSTOL 50MCG HALF TABLET
50.0000 ug | ORAL_TABLET | ORAL | Status: DC | PRN
  Administered 2018-01-11: 50 ug via BUCCAL
  Filled 2018-01-11: qty 1

## 2018-01-11 MED ORDER — LACTATED RINGERS IV SOLN
INTRAVENOUS | Status: DC
  Administered 2018-01-11 (×2): via INTRAVENOUS

## 2018-01-11 MED ORDER — LIDOCAINE HCL (PF) 1 % IJ SOLN
INTRAMUSCULAR | Status: DC | PRN
  Administered 2018-01-11 (×2): 4 mL via EPIDURAL

## 2018-01-11 MED ORDER — IBUPROFEN 600 MG PO TABS
600.0000 mg | ORAL_TABLET | Freq: Four times a day (QID) | ORAL | Status: DC
  Administered 2018-01-11 – 2018-01-13 (×7): 600 mg via ORAL
  Filled 2018-01-11 (×6): qty 1

## 2018-01-11 MED ORDER — PHENYLEPHRINE 40 MCG/ML (10ML) SYRINGE FOR IV PUSH (FOR BLOOD PRESSURE SUPPORT)
80.0000 ug | PREFILLED_SYRINGE | INTRAVENOUS | Status: DC | PRN
  Filled 2018-01-11 (×2): qty 10

## 2018-01-11 MED ORDER — WITCH HAZEL-GLYCERIN EX PADS
1.0000 "application " | MEDICATED_PAD | CUTANEOUS | Status: DC | PRN
  Administered 2018-01-12: 1 via TOPICAL

## 2018-01-11 MED ORDER — OXYTOCIN 40 UNITS IN LACTATED RINGERS INFUSION - SIMPLE MED
2.5000 [IU]/h | INTRAVENOUS | Status: DC
  Administered 2018-01-11: 2.5 [IU]/h via INTRAVENOUS
  Filled 2018-01-11: qty 1000

## 2018-01-11 MED ORDER — FENTANYL 2.5 MCG/ML BUPIVACAINE 1/10 % EPIDURAL INFUSION (WH - ANES)
14.0000 mL/h | INTRAMUSCULAR | Status: DC | PRN
  Administered 2018-01-11: 14 mL/h via EPIDURAL
  Filled 2018-01-11: qty 100

## 2018-01-11 MED ORDER — ONDANSETRON HCL 4 MG/2ML IJ SOLN: 4.0000 mg | INTRAMUSCULAR | Status: DC | PRN

## 2018-01-11 MED ORDER — TERBUTALINE SULFATE 1 MG/ML IJ SOLN
0.2500 mg | Freq: Once | INTRAMUSCULAR | Status: DC | PRN
  Filled 2018-01-11: qty 1

## 2018-01-11 MED ORDER — DIBUCAINE 1 % RE OINT
1.0000 "application " | TOPICAL_OINTMENT | RECTAL | Status: DC | PRN
  Administered 2018-01-12: 1 via RECTAL
  Filled 2018-01-11: qty 28

## 2018-01-11 MED ORDER — LACTATED RINGERS IV SOLN: 500.0000 mL | INTRAVENOUS | Status: DC | PRN

## 2018-01-11 MED ORDER — SENNOSIDES-DOCUSATE SODIUM 8.6-50 MG PO TABS
2.0000 | ORAL_TABLET | ORAL | Status: DC
  Administered 2018-01-11 – 2018-01-13 (×2): 2 via ORAL
  Filled 2018-01-11 (×2): qty 2

## 2018-01-11 MED ORDER — COCONUT OIL OIL: 1.0000 "application " | TOPICAL_OIL | Status: DC | PRN

## 2018-01-11 MED ORDER — TETANUS-DIPHTH-ACELL PERTUSSIS 5-2.5-18.5 LF-MCG/0.5 IM SUSP: 0.5000 mL | Freq: Once | INTRAMUSCULAR | Status: DC

## 2018-01-11 MED ORDER — LACTATED RINGERS IV SOLN: 500.0000 mL | Freq: Once | INTRAVENOUS | Status: DC

## 2018-01-11 MED ORDER — OXYCODONE-ACETAMINOPHEN 5-325 MG PO TABS: 2.0000 | ORAL_TABLET | ORAL | Status: DC | PRN

## 2018-01-11 MED ORDER — SOD CITRATE-CITRIC ACID 500-334 MG/5ML PO SOLN: 30.0000 mL | ORAL | Status: DC | PRN

## 2018-01-11 MED ORDER — LACTATED RINGERS IV SOLN
500.0000 mL | Freq: Once | INTRAVENOUS | Status: AC
  Administered 2018-01-11: 500 mL via INTRAVENOUS

## 2018-01-11 MED ORDER — SIMETHICONE 80 MG PO CHEW: 80.0000 mg | CHEWABLE_TABLET | ORAL | Status: DC | PRN

## 2018-01-11 MED ORDER — PRENATAL MULTIVITAMIN CH
1.0000 | ORAL_TABLET | Freq: Every day | ORAL | Status: DC
  Administered 2018-01-12: 1 via ORAL
  Filled 2018-01-11: qty 1

## 2018-01-11 MED ORDER — DIPHENHYDRAMINE HCL 50 MG/ML IJ SOLN: 12.5000 mg | INTRAMUSCULAR | Status: DC | PRN

## 2018-01-11 MED ORDER — OXYCODONE-ACETAMINOPHEN 5-325 MG PO TABS: 1.0000 | ORAL_TABLET | ORAL | Status: DC | PRN

## 2018-01-11 NOTE — Anesthesia Pain Management Evaluation Note (Signed)
  CRNA Pain Management Visit Note  Patient: Gail Garcia, 32 y.o., female  "[facility] We have an anesthesia team available at all times to provide care throughout the hospital, including epidural management and anesthesia for C-section. I don't know your plan for the delivery whether it a natural birth, water birth, IV sedation, nitrous supplementation, doula or epidural, but we want to meet your pain goals."   1.Was your pain managed to your expectations on prior hospitalizations?   Yes   2.What is your expectation for pain management during this hospitalization?     IV pain meds  3.How can we help you reach that goal? Intravenous pain management only  Record the patient's initial score and the patient's pain goal.   Pain: 2  Pain Goal: 6 [facility] wants you to be able to say your pain was always managed very well.  Ulice BoldDavid Adedayo [facility] 01/11/2018

## 2018-01-11 NOTE — Progress Notes (Signed)
Deeann Saintianza M [place] MRN: [id number]  Subjective: -Patient resting in bed.  Reports comfort s/p epidural placement.  Endorses fetal movement and denies q/c.  States she feels baby boy "Alphonzo GrieveXxavier" may be "a little bigger" than her last baby which was 7lbs+. However, patient denies issues during delivery. FOB at bedside, supportive.   Objective: BP 134/75   Pulse 82   Temp 98.4 F (36.9 C) (Oral)   Resp 20   Ht 5\' 7"  (1.702 m)   Wt 93.5 kg   LMP 03/19/2017 (Exact Date)   BMI 32.28 kg/m  No intake/output data recorded. No intake/output data recorded.  Fetal Monitoring: FHT: 135 bpm, Mod Var, -Decels, +Accels UC: Q1-14min, palpates moderate    Vaginal Exam: SVE:   Dilation: 5 Effacement (%): 50 Station: -1 Exam by:: Shanda BumpsJessica, CNM Membranes:AROM of Moderate amt Clear fluid Internal Monitors: None  Augmentation/Induction: Pitocin:None Cytotec: s/p 1 dose at 0905  Assessment:  IUP at 39wks Cat I FT  IOL  GDM-A2   Plan: -Discussed AROM r/b including increased risk of infection, cord prolapse, fetal intolerance, and decreased labor time. No questions or concerns and patient desires to proceed with AROM.  -Contraction pattern sufficient; will hold pitocin at current -Next CBG due at ~Noon -Continue other mgmt as ordered   Valma CavaJessica L Lenford Beddow,MSN, CNM 01/11/2018, 10:48 AM

## 2018-01-11 NOTE — Anesthesia Procedure Notes (Signed)
Epidural Patient location during procedure: OB Start time: 01/11/2018 10:07 AM End time: 01/11/2018 10:10 AM  Staffing Anesthesiologist: Kaylyn LayerHowze, Michaeljohn Biss E, MD Performed: anesthesiologist   Preanesthetic Checklist Completed: patient identified, pre-op evaluation, timeout performed, IV checked, risks and benefits discussed and monitors and equipment checked  Epidural Patient position: sitting Prep: site prepped and draped and DuraPrep Patient monitoring: continuous pulse ox, blood pressure, heart rate and cardiac monitor Approach: midline Location: L3-L4 Injection technique: LOR air  Needle:  Needle type: Tuohy  Needle gauge: 17 G Needle length: 9 cm Needle insertion depth: 6 cm Catheter type: closed end flexible Catheter size: 19 Gauge Catheter at skin depth: 11 cm Test dose: negative and Other (1% lidocaine)  Assessment Events: blood not aspirated, injection not painful, no injection resistance, negative IV test and no paresthesia  Additional Notes Patient identified. Risks, benefits, and alternatives discussed with patient including but not limited to bleeding, infection, nerve damage, paralysis, failed block, incomplete pain control, headache, blood pressure changes, nausea, vomiting, reactions to medication, itching, and postpartum back pain. Confirmed with bedside nurse the patient's most recent platelet count. Confirmed with patient that they are not currently taking any anticoagulation, have any bleeding history, or any family history of bleeding disorders. Patient expressed understanding and wished to proceed. All questions were answered. Sterile technique was used throughout the entire procedure. Crisp LOR on first pass. Please see nursing notes for vital signs. Test dose was given through epidural catheter and negative prior to continuing to dose epidural or start infusion. Warning signs of high block given to the patient including shortness of breath, tingling/numbness in  hands, complete motor block, or any concerning symptoms with instructions to call for help. Patient was given instructions on fall risk and not to get out of bed. All questions and concerns addressed with instructions to call with any issues or inadequate analgesia.  Reason for block:procedure for pain

## 2018-01-11 NOTE — Anesthesia Preprocedure Evaluation (Signed)
Anesthesia Evaluation  Patient identified by MRN, date of birth, ID band Patient awake    Reviewed: Allergy & Precautions, Patient's Chart, lab work & pertinent test results  History of Anesthesia Complications Negative for: history of anesthetic complications  Airway Mallampati: II  TM Distance: >3 FB Neck ROM: Full    Dental  (+) Dental Advisory Given, Chipped,    Pulmonary asthma , former smoker,    Pulmonary exam normal breath sounds clear to auscultation       Cardiovascular negative cardio ROS Normal cardiovascular exam Rhythm:Regular Rate:Normal     Neuro/Psych negative neurological ROS  negative psych ROS   GI/Hepatic negative GI ROS, Neg liver ROS,   Endo/Other  diabetes, Gestational  Renal/GU negative Renal ROS  negative genitourinary   Musculoskeletal negative musculoskeletal ROS (+)   Abdominal   Peds negative pediatric ROS (+)  Hematology  (+) anemia ,   Anesthesia Other Findings   Reproductive/Obstetrics (+) Pregnancy                             Anesthesia Physical Anesthesia Plan  ASA: II  Anesthesia Plan: Epidural   Post-op Pain Management:    Induction:   PONV Risk Score and Plan: 2 and Treatment may vary due to age or medical condition  Airway Management Planned: Natural Airway  Additional Equipment:   Intra-op Plan:   Post-operative Plan:   Informed Consent: I have reviewed the patients History and Physical, chart, labs and discussed the procedure including the risks, benefits and alternatives for the proposed anesthesia with the patient or authorized representative who has indicated his/her understanding and acceptance.     Plan Discussed with: CRNA  Anesthesia Plan Comments:         Anesthesia Quick Evaluation

## 2018-01-11 NOTE — H&P (Signed)
OBSTETRIC ADMISSION HISTORY AND PHYSICAL  Gail Garcia is a 32 y.o. female [phone] with IUP at 27w0dby L/15 presenting for IOL for A2GDM. Did not take insulin last night. Feeling well this morning.   Reports fetal movement. Denies vaginal bleeding, leakage of fluids. Reports only occasional contractions.   [facility]  Support person in labor: Husband Gail Garcia. 15w6: EFW 65%, anatomy U/S performed based on LMP but date change based on scan, limited cardiac, spine and facial views . 19w6: posterior placenta, EFW 52%, echogenic intracardiac focus, rest of completion of anatomy U/S wnl . 33w6: EFW 2440g 72%, BPP 8/8 . 38w0: EFW 3361 (77%)  Prenatal History/Complications: . A2GDM: NPH 12U nightly  . Iron deficiency anemia on ferrous fumarate . 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 Anemia   . Asthma   . Gestational diabetes    insulin    Past Surgical History: Past Surgical History:  Procedure Laterality Date  . DILATION AND CURETTAGE OF UTERUS      Obstetrical History: OB History    Gravida  4   Para  2   Term  2   Preterm  0   AB  1   Living  2     SAB  1   TAB  0   Ectopic  0   Multiple  0   Live Births  2           Social History: Social History   Socioeconomic History  . Marital status: Single    Spouse name: Not on file  . Number of children: Not on file  . Years of education: Not on file  . Highest education level: Not on file  Occupational History  . Not on file  Social Needs  . Financial resource strain: Not hard at all  . Food insecurity:    Worry: Never true    Inability: Never true  . Transportation needs:    Medical: No    Non-medical: Not on file  Tobacco Use  . Smoking status: Former Smoker    Types: Cigarettes    Last attempt to quit: 05/02/2014    Years since quitting: 3.6  . Smokeless tobacco: Never Used  Substance and  Sexual Activity  . Alcohol use: Yes  . Drug use: Yes    Types: Marijuana    Comment: early in pregnancy  . Sexual activity: Yes    Birth control/protection: None  Lifestyle  . Physical activity:    Days per week: Not on file    Minutes per session: Not on file  . Stress: To some extent  Relationships  . Social connections:    Talks on phone: Not on file    Gets together: Not on file    Attends religious service: Not on file    Active member of club or organization: Not on file    Attends meetings of clubs or organizations: Not on file    Relationship status: Not on file  Other Topics Concern  . Not on file  Social History Narrative  . Not on file    Family History: Family History  Problem Relation Age of Onset  . Asthma Mother   . Diabetes Mother   . Cancer Mother   . Cancer Father        lung  . Kidney disease Maternal Uncle   . Alcohol  abuse Neg Hx   . Arthritis Neg Hx   . Birth defects Neg Hx   . COPD Neg Hx   . Depression Neg Hx   . Drug abuse Neg Hx   . Early death Neg Hx   . Hearing loss Neg Hx   . Heart disease Neg Hx   . Hyperlipidemia Neg Hx   . Hypertension Neg Hx   . Learning disabilities Neg Hx   . Mental illness Neg Hx   . Mental retardation Neg Hx   . Vision loss Neg Hx   . Miscarriages / Stillbirths Neg Hx   . Stroke Neg Hx   . Varicose Veins Neg Hx     Allergies: No Known Allergies  Medications Prior to Admission  Medication Sig Dispense Refill Last Dose  . ACCU-CHEK FASTCLIX LANCETS MISC 1 Device by Percutaneous route 4 (four) times daily. 100 each 12 Taking  . Blood Glucose Monitoring Suppl (ACCU-CHEK GUIDE) w/Device KIT 1 Device by Does not apply route 4 (four) times daily. 1 kit 0 Taking  . docusate sodium (COLACE) 100 MG capsule Take 100 mg by mouth 2 (two) times daily as needed for mild constipation.   Taking  . Elastic Bandages & Supports (COMFORT FIT MATERNITY SUPP MED) MISC 1 Device by Does not apply route daily as needed. 1 each  0 Taking  . Ferrous Fumarate (HEMOCYTE - 106 MG FE) 324 (106 Fe) MG TABS tablet Take 1 tablet by mouth.   Taking  . glucose blood test strip Use as instructed 100 each 12 Taking  . insulin NPH Human (HUMULIN N,NOVOLIN N) 100 UNIT/ML injection Inject 0.12 mLs (12 Units total) into the skin at bedtime. 10 mL 3 Taking  . Insulin Syringe-Needle U-100 (INSULIN SYRINGE 1CC/31GX5/16") 31G X 5/16" 1 ML MISC 1 each by Does not apply route 4 (four) times daily. 100 each 0 Taking  . Prenatal Vit-Fe Fumarate-FA (PRENATAL COMPLETE) 14-0.4 MG TABS Take 1 tablet by mouth daily. 30 each 12 Taking     Review of Systems  All systems reviewed and negative except as stated in HPI  Blood pressure 139/72, pulse 93, temperature 98.4 F (36.9 C), temperature source Oral, resp. rate 18, height '5\' 7"'  (1.702 m), weight 93.5 kg, last menstrual period 03/19/2017, unknown if currently breastfeeding. General appearance: well-appearing, NAD Lungs: no respiratory distress Heart: regular rate  Abdomen: soft, non-tender; gravid Pelvic: deferred Extremities: trace lower extremity edema  Presentation: cephalic by sutures Fetal monitoring: 140s/mod/+a/-d; contractions every 2-5 minutes     Prenatal labs: ABO, Rh: --/--/O POS (12/24 6168) Antibody: NEG (12/24 0743) Rubella: 1.73 (06/26 1111) RPR: Non Reactive (09/30 0858)  HBsAg: Negative (06/26 1111)  HIV: Non Reactive (09/30 0858)  GBS: Negative (12/04 0000)  Glucola: abnormal Genetic screening:  Low risk NIPS, female  Prenatal Transfer Tool  Maternal Diabetes: Yes:  Diabetes Type:  Insulin/Medication controlled Genetic Screening: Normal Maternal Ultrasounds/Referrals: resolved echogenic intracardiac focus Fetal Ultrasounds or other Referrals:  None Maternal Substance Abuse:  No Significant Maternal Medications:  Meds include: Other: insulin, iron, PNV Significant Maternal Lab Results: None  Results for orders placed or performed during the hospital encounter  of 01/11/18 (from the past 24 hour(s))  CBC   Collection Time: 01/11/18  7:26 AM  Result Value Ref Range   WBC 10.6 (H) 4.0 - 10.5 K/uL   RBC 3.44 (L) 3.87 - 5.11 MIL/uL   Hemoglobin 9.9 (L) 12.0 - 15.0 g/dL   HCT 29.9 (L) 36.0 - 46.0 %  MCV 86.9 80.0 - 100.0 fL   MCH 28.8 26.0 - 34.0 pg   MCHC 33.1 30.0 - 36.0 g/dL   RDW 14.6 11.5 - 15.5 %   Platelets 301 150 - 400 K/uL   nRBC 0.0 0.0 - 0.2 %  Type and screen   Collection Time: 01/11/18  7:43 AM  Result Value Ref Range   ABO/RH(D) O POS    Antibody Screen NEG    Sample Expiration      01/14/2018 Performed at [facility], [address], [place], [place] [address]   Glucose, capillary   Collection Time: 01/11/18  8:07 AM  Result Value Ref Range   Glucose-Capillary 122 (H) 70 - 99 mg/dL    Patient Active Problem List   Diagnosis Date Noted  . Encounter for induction of labor 01/11/2018  . Gestational diabetes 10/19/2017  . Low-lying placenta in second trimester 08/15/2017  . 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 Chlamydia trachomatis infection in pregnancy in second trimester 07/16/2017  . Supervision of high risk pregnancy, antepartum 07/14/2017  . Asthma affecting pregnancy in second trimester 07/14/2017    Assessment/Plan:  Gail Garcia is a 32 y.o. [id number] at 31w0dhere for IOL for A2GDM.  Labor: Will start induction with FB and cytotec. Once FB out will try for early AROM given head well-applied and then transitioning to pitocin.  -- pain control: open to epidural, currently hoping not to need it  Fetal Wellbeing: EFW 8lbs by Leopold's. Cephalic by sutures.  -- GBS (negative) -- continuous fetal monitoring - category I   A2GDM: Well-controlled, reports FBGs in 80-90s. Takes NPH at night, did not take last night. Had breakfast this morning and initial BG 122. Will check every 4 hours and if persistently > 120, will start glucose stabilizer.   Postpartum Planning -- breast/undecided -- RI/'[x]' Tdap/'[x]' flu  Laurel  S. WJuleen China DO OB/GYN Fellow

## 2018-01-11 NOTE — Lactation Note (Signed)
This note was copied from a baby's chart. Lactation Consultation Note  Patient Name: Gail Garcia RJJOA'CToday's Date: 01/11/2018 Reason for consult: Initial assessment;Term;Maternal endocrine disorder Type of Endocrine Disorder?: Diabetes(GDM-insulin)  8 hours old FT female who is being partially BF and formula fed by his mother, she's a P3. Mom had GDM insulin dependent during the pregnancy; and baby has already started supplementation with Rush BarerGerber Gentle; that was mother's feeding choice upon admission, parents are using a slow flow nipple. When assisting with hand expression, noticed that mom's nipples are flat, her right one is not as flat as her left one, mom able to get small droplets of colostrum at this time. Set mom up with breast shells (dad will bring a bra to the hospital tomorrow) and a hand pump per mom's request, she doesn't have a pump and requested one from the hospital to take home.  Baby was asleep when entering the room, offered assistance with latch and mom agreed to wake baby up to feed. LC suggested to check baby's diaper, he had a stool, LC documented in Flowsheets, dad changed baby. Dad had lots of questions related to mom's medical status and their discharge, redirected him to mom's RN and baby's doctor. Dad is not supportive with BF, mom had to convince him to hand him baby to nurse, dad voiced; "I don't want you to be BF in public, can't you just pump your milk and put it in a bottle?". LC assisted mom with latching baby on, taking baby STS to the left breast in football position, but baby wasn't ready to feed, he kept falling asleep and only did a few sucks. Documented attempt in Flowsheets as well.  Feeding plan:  1. Encouraged mom to feed baby STS 8-12 times/24 hours or sooner if feeding cues are present 2. If mom offers baby a bottle of formula, she'll follow supplementation guidelines according to baby's age in hours 3. She'll pre-pump for 5 minutes prior feedings at the  breast 4. She'll start wearing her breast shells tomorrow once dad brings her bra to the hospital  BF brochure, BF resources and feeding diary were reviewed. Parents reported all questions and concerns were answered, they're both aware of LC services and will call PRN.  Maternal Data Formula Feeding for Exclusion: Yes Reason for exclusion: Mother's choice to formula and breast feed on admission Has patient been taught Hand Expression?: Yes Does the patient have breastfeeding experience prior to this delivery?: Yes  Feeding Feeding Type: (attempt) Nipple Type: Slow - flow  Interventions Interventions: Breast feeding basics reviewed;Assisted with latch;Skin to skin;Breast massage;Hand express;Reverse pressure;Breast compression;Hand pump;Shells;Support pillows;Adjust position  Lactation Tools Discussed/Used Tools: Pump;Shells Shell Type: Inverted Breast pump type: Manual WIC Program: No Pump Review: Setup, frequency, and cleaning;Milk Storage Initiated by:: MPeck Date initiated:: 01/11/18   Consult Status Consult Status: PRN Date: 01/12/18 Follow-up type: In-patient    Gail Garcia 01/11/2018, 9:39 PM

## 2018-01-12 NOTE — Anesthesia Postprocedure Evaluation (Signed)
Anesthesia Post Note  Patient: Gail Garcia  Procedure(s) Performed: AN AD HOC LABOR EPIDURAL     Patient location during evaluation: Mother Baby Anesthesia Type: Epidural Level of consciousness: awake and alert Pain management: pain level controlled Vital Signs Assessment: post-procedure vital signs reviewed and stable Respiratory status: spontaneous breathing, nonlabored ventilation and respiratory function stable Cardiovascular status: stable Postop Assessment: no headache, no backache and epidural receding Anesthetic complications: no    Last Vitals:  Vitals:   01/12/18 0525 01/12/18 1356  BP: 128/83 119/66  Pulse: 80 87  Resp:  20  Temp:  36.6 C  SpO2:      Last Pain:  Vitals:   01/12/18 1356  TempSrc: Oral  PainSc:    Pain Goal:                 Hattie Pine

## 2018-01-13 MED ORDER — PRENATAL MULTIVITAMIN CH: 1.0000 | ORAL_TABLET | Freq: Every day | ORAL | 3 refills | Status: DC

## 2018-01-13 MED ORDER — ACETAMINOPHEN 325 MG PO TABS: 650.0000 mg | ORAL_TABLET | ORAL | 0 refills | Status: AC | PRN

## 2018-01-13 MED ORDER — DIBUCAINE 1 % RE OINT: 1.0000 "application " | TOPICAL_OINTMENT | RECTAL | 0 refills | Status: DC | PRN

## 2018-01-13 MED ORDER — BENZOCAINE-MENTHOL 20-0.5 % EX AERO: 1.0000 "application " | INHALATION_SPRAY | CUTANEOUS | 0 refills | Status: DC | PRN

## 2018-01-13 MED ORDER — WITCH HAZEL-GLYCERIN EX PADS: 1.0000 "application " | MEDICATED_PAD | CUTANEOUS | 12 refills | Status: DC | PRN

## 2018-01-13 MED ORDER — IBUPROFEN 800 MG PO TABS: 800.0000 mg | ORAL_TABLET | Freq: Three times a day (TID) | ORAL | 0 refills | Status: AC | PRN

## 2018-01-13 NOTE — Discharge Summary (Signed)
Postpartum Discharge Summary     Patient Name: Gail Garcia DOB: 1985/12/25 MRN: [id number]  Date of admission: 01/11/2018 Delivering Provider: Gavin Pound   Date of discharge: 01/13/2018  Admitting diagnosis: 39wks induction Intrauterine pregnancy: [redacted]w[redacted]d    Secondary diagnosis:  Active Problems:   Asthma affecting pregnancy in second trimester   Low-lying placenta in second trimester   Gestational diabetes   Encounter for induction of labor   SVD (spontaneous vaginal delivery)  Additional problems:      Discharge diagnosis: Term Pregnancy Delivered and GDM A2                                                                                                Post partum procedures:N/A  Augmentation: Cytotec and Foley Balloon  Complications: None  Hospital course:  Induction of Labor With Vaginal Delivery   32y.o. yo [phone] 336w0das admitted to the hospital 01/11/2018 for induction of labor.  Indication for induction: A2 DM.  Patient had an uncomplicated labor course as follows: Membrane Rupture Time/Date: 10:57 AM ,01/11/2018   Intrapartum Procedures: Episiotomy: None [1]                                         Lacerations:  None [1]  Patient had delivery of a Viable infant.  Information for the patient's newborn:  WaEnsley, Blas0[[id number]]Delivery Method: Vaginal, Spontaneous(Filed from Delivery Summary)   01/11/2018  Details of delivery can be found in separate delivery note.  Patient had a routine postpartum course. Patient is discharged home 01/13/18.  Magnesium Sulfate recieved: No BMZ received: No  Physical exam  Vitals:   01/12/18 0525 01/12/18 1356 01/12/18 2220 01/13/18 0608  BP: 128/83 119/66 122/71 116/74  Pulse: 80 87 82 84  Resp:  _0 Temp:  97.9 F (36.6 C) 97.8 F (36.6 C) 98 F (36.7 C)  TempSrc:  Oral Oral Oral  SpO2:   100%   Weight:      Height:       General: alert, cooperative and no distress Lochia:  appropriate Uterine Fundus: firm Incision: N/A DVT Evaluation: Negative Homan's sign. Labs: Lab Results  Component Value Date   WBC 10.6 (H) 01/11/2018   HGB 9.9 (L) 01/11/2018   HCT 29.9 (L) 01/11/2018   MCV 86.9 01/11/2018   PLT 301 01/11/2018   CMP Latest Ref Rng & Units 12/01/2017  Glucose 65 - 99 mg/dL 76  BUN 6 - 20 mg/dL 6  Creatinine 0.57 - 1.00 mg/dL 0.43(L)  Sodium 134 - 144 mmol/L 138  Potassium 3.5 - 5.2 mmol/L 3.4(L)  Chloride 96 - 106 mmol/L 104  CO2 20 - 29 mmol/L 18(L)  Calcium 8.7 - 10.2 mg/dL 9.0  Total Protein 6.0 - 8.5 g/dL 6.2  Total Bilirubin 0.0 - 1.2 mg/dL <0.2  Alkaline Phos 39 - 117 IU/L 136(H)  AST 0 - 40 IU/L 10  ALT 0 - 32 IU/L 10  Discharge instruction: per After Visit Summary and "Baby and Me Booklet".  After visit meds:  Allergies as of 01/13/2018   No Known Allergies     Medication List    STOP taking these medications   ACCU-CHEK FASTCLIX LANCETS Misc   ACCU-CHEK GUIDE w/Device Kit   COMFORT FIT MATERNITY SUPP MED Misc   docusate sodium 100 MG capsule Commonly known as:  COLACE   Ferrous Fumarate 324 (106 Fe) MG Tabs tablet Commonly known as:  HEMOCYTE - 106 mg FE   glucose blood test strip   insulin NPH Human 100 UNIT/ML injection Commonly known as:  HUMULIN N,NOVOLIN N   INSULIN SYRINGE 1CC/31GX5/16" 31G X 5/16" 1 ML Misc   PRENATAL COMPLETE 14-0.4 MG Tabs     TAKE these medications   acetaminophen 325 MG tablet Commonly known as:  TYLENOL Take 2 tablets (650 mg total) by mouth every 4 (four) hours as needed (for pain scale < 4). What changed:    when to take this  reasons to take this   benzocaine-Menthol 20-0.5 % Aero Commonly known as:  DERMOPLAST Apply 1 application topically as needed for irritation (perineal discomfort).   dibucaine 1 % Oint Commonly known as:  NUPERCAINAL Place 1 application rectally as needed for hemorrhoids.   ibuprofen 800 MG tablet Commonly known as:  ADVIL,MOTRIN Take 1  tablet (800 mg total) by mouth every 8 (eight) hours as needed.   prenatal multivitamin Tabs tablet Take 1 tablet by mouth daily at 12 noon.   witch hazel-glycerin pad Commonly known as:  TUCKS Apply 1 application topically as needed for hemorrhoids.       Diet: routine diet  Activity: Advance as tolerated. Pelvic rest for 6 weeks.   Outpatient follow up:4 weeks Follow up Appt:No future appointments. Follow up Visit: Message sent to clinic   Please schedule this patient for Postpartum visit in: 4 weeks with the following provider: Any provider For C/S patients schedule nurse incision check in weeks 2 weeks: no Low risk pregnancy complicated by: GDM Delivery mode:  Vacuum Anticipated Birth Control:  other/unsure PP Procedures needed: 2 hour GTT  Schedule Integrated BH visit: no      Newborn Data: Live born female  Birth Weight: 7 lb 3.2 oz (3265 g) APGAR: 9, 9  Newborn Delivery   Birth date/time:  01/11/2018 12:53:00 Delivery type:  Vaginal, Spontaneous     Baby Feeding: Breast Disposition:home with mother   Darlina Rumpf, CNM 01/13/18  10:26 AM

## 2018-01-13 NOTE — Lactation Note (Addendum)
This note was copied from a baby's chart. Lactation Consultation Note  Patient Name: Gail Holley Raringianza Seder WUJWJ'XToday's Date: 01/13/2018 Reason for consult: Follow-up assessment;Term Type of Endocrine Disorder?: Diabetes P3, 37 hour female infant. Mom's feeding choice at admission was breastfeeding and supplementing with formula. Per mom, she has not been wearing breast shells as earlier advised due to not having bra with her in hospital. LC gave temporary bra and mom will start to wear breast shells tomorrow  Mom hand expressed and 2 ml of colostrum was given  to infant by foley cup  Per mom , she wants to breastfeed more and offer less formula. Per mom, she tried latch infant only once today she was having  difficulties with latching infant to breast.   Mom demonstrated hand expression and infant was given 2 ml by foley cup. Mom latched infant after a few attempts to right breast using the football hold, audible swallows were observed. Infant breastfeeding for 15 minutes. Mom will use DEBP every 3 hours for 15 minutes per pumping session. Mom was still Breastfeeding as LC left the room.  Mom shown how to use DEBP & how to disassemble, clean, & reassemble parts. BF plans: 1. Mom plans to breastfeed according hunger cues and not exceed 3 hours without breastfeeding infant. 2. Mom plans to supplement with formula after breastfeeding if infant is still cuing. 3. Mom will wear shells during the day in bra and pre-pump if needed due short shafted nipples.  4. Mom will ask Nurse or LC if she need any help or further assistance with latching infant to breast.   Maternal Data Formula Feeding for Exclusion: Yes Reason for exclusion: Mother's choice to formula and breast feed on admission  Feeding Feeding Type: Breast Fed  LATCH Score Latch: Grasps breast easily, tongue down, lips flanged, rhythmical sucking.  Audible Swallowing: Spontaneous and intermittent  Type of Nipple: Everted at rest and after  stimulation  Comfort (Breast/Nipple): Soft / non-tender  Hold (Positioning): Assistance needed to correctly position infant at breast and maintain latch.  LATCH Score: 9  Interventions Interventions: Assisted with latch;Skin to skin;Breast compression;Breast massage;Hand express;DEBP;Expressed milk;Position options;Support pillows;Shells  Lactation Tools Discussed/Used Tools: Shells Shell Type: Other (comment)(short shafted ) WIC Program: No Pump Review: Setup, frequency, and cleaning;Milk Storage Initiated by:: Danelle Earthlyobin Malikai Gut, IBCLC Date initiated:: 01/13/18   Consult Status Consult Status: Follow-up Date: 01/14/18 Follow-up type: In-patient    Danelle EarthlyRobin Janal Haak 01/13/2018, 2:49 AM

## 2018-01-13 NOTE — Lactation Note (Signed)
This note was copied from a baby's chart. Lactation Consultation Note  Patient Name: Gail Garcia ZOXWR'UToday's Date: 01/13/2018 Reason for consult: Follow-up assessment  Visited with P3 Mom of term baby on day of discharge.  Baby 45 hrs old, baby 5% weight loss.  Mom is breastfeeding and formula feeding.  Encouraged Mom to latch baby to the breast first, offering formula prn.  Talked about milk supply being dependent on milk removal.  DEBP set up at bedside.  Mom doesn't have a DEBP at home.  Showed Mom how to disassemble pump parts to create a double pump, and single pump.   Offered assistance with latching, but Mom declined.   Engorgement prevention and treatment reviewed. Mom aware of OP lactation support available   Mom encouraged to call prn.   Consult Status Consult Status: Complete Date: 01/13/18 Follow-up type: Call as needed    Judee ClaraSmith, Brolin Dambrosia E 01/13/2018, 10:22 AM

## 2018-01-13 NOTE — Progress Notes (Signed)
Post Partum Day 1 Subjective: no complaints, up ad lib, voiding and tolerating PO  Objective: Blood pressure 122/71, pulse 82, temperature 97.8 F (36.6 C), temperature source Oral, resp. rate 14, height 5\' 7"  (1.702 m), weight 93.5 kg, last menstrual period 03/19/2017, SpO2 100 %, unknown if currently breastfeeding.  Physical Exam:  General: alert, cooperative, appears stated age and no distress Lochia: appropriate Uterine Fundus: firm Incision: healing well, no significant drainage, no dehiscence, no significant erythema DVT Evaluation: No evidence of DVT seen on physical exam.  Recent Labs    01/11/18 0726  HGB 9.9*  HCT 29.9*    Assessment/Plan: Plan for discharge tomorrow   LOS: 1 days   Gail Garcia 01/12/2018, 5:35 PM

## 2018-01-13 NOTE — Discharge Instructions (Signed)

## 2018-01-18 ENCOUNTER — Inpatient Hospital Stay (HOSPITAL_COMMUNITY): Admit: 2018-01-18 | Payer: Self-pay

## 2018-01-25 ENCOUNTER — Encounter: Payer: Self-pay | Admitting: *Deleted

## 2018-02-08 ENCOUNTER — Other Ambulatory Visit: Payer: Self-pay | Admitting: *Deleted

## 2018-02-08 DIAGNOSIS — Z8632 Personal history of gestational diabetes: Secondary | ICD-10-CM

## 2018-02-11 ENCOUNTER — Ambulatory Visit: Payer: Medicaid Other | Admitting: Nurse Practitioner

## 2018-02-11 ENCOUNTER — Other Ambulatory Visit: Payer: Medicaid Other

## 2018-02-15 ENCOUNTER — Ambulatory Visit: Payer: Medicaid Other | Admitting: Student

## 2018-02-15 ENCOUNTER — Other Ambulatory Visit: Payer: Medicaid Other

## 2018-02-17 ENCOUNTER — Other Ambulatory Visit: Payer: Medicaid Other

## 2018-08-30 ENCOUNTER — Encounter: Payer: Self-pay | Admitting: Emergency Medicine

## 2018-08-30 ENCOUNTER — Other Ambulatory Visit: Payer: Self-pay

## 2018-08-30 ENCOUNTER — Ambulatory Visit
Admission: EM | Admit: 2018-08-30 | Discharge: 2018-08-30 | Disposition: A | Discharge status: Home / Self Care | Payer: Medicaid Other

## 2018-08-30 DIAGNOSIS — Z20828 Contact with and (suspected) exposure to other viral communicable diseases: Secondary | ICD-10-CM | POA: Diagnosis not present

## 2018-08-30 DIAGNOSIS — Z20822 Contact with and (suspected) exposure to covid-19: Secondary | ICD-10-CM

## 2018-08-30 DIAGNOSIS — F418 Other specified anxiety disorders: Secondary | ICD-10-CM

## 2018-08-30 DIAGNOSIS — R4589 Other symptoms and signs involving emotional state: Secondary | ICD-10-CM

## 2018-08-30 NOTE — ED Provider Notes (Signed)
[facility]    CSN: [id number] Arrival date & time: 08/30/18  1524     History   Chief Complaint Chief Complaint  Patient presents with  . COVID Exposure    HPI Gail Garcia is a 33 y.o. female with history of asthma, anemia presenting for covid testing.  Patient states a coworker tested positive for COVID the other week.  States she works in [employer], has appropriate PPE as to other employees, though her employer is "requiring testing for everybody".  Patient states that she has remained afebrile, asymptomatic throughout the COVID pandemic.  Patient denies known close, unprotected exposure to covered positive persons.  No other sick contacts in her personal life.  Patient states she is nervous about the recent positive test at work.  Patient does endorse history of anxiety: Denies previous attempted self-harm, SI/HI today.  Patient feels that she will be reassured after taking a test.   Past Medical History:  Diagnosis Date  . Anemia   . Asthma   . Gestational diabetes    insulin    Patient Active Problem List   Diagnosis Date Noted  . Encounter for induction of labor 01/11/2018  . SVD (spontaneous vaginal delivery) 01/11/2018  . Gestational diabetes 10/19/2017  . Low-lying placenta in second trimester 08/15/2017  . 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 Chlamydia trachomatis infection in pregnancy in second trimester 07/16/2017  . Supervision of high risk pregnancy, antepartum 07/14/2017  . Asthma affecting pregnancy in second trimester 07/14/2017    Past Surgical History:  Procedure Laterality Date  . DILATION AND CURETTAGE OF UTERUS      OB History    Gravida  4   Para  3   Term  3   Preterm  0   AB  1   Living  3     SAB  1   TAB  0   Ectopic  0   Multiple  0   Live Births  3            Home Medications    Prior to Admission medications   Medication Sig Start Date End Date Taking? Authorizing Provider  ferrous sulfate 325 (65 FE)  MG tablet Take 325 mg by mouth daily with breakfast.   Yes [provider]  Multiple Vitamins-Calcium (ONE-A-DAY WOMENS FORMULA PO) Take by mouth.   Yes [provider]  acetaminophen (TYLENOL) 325 MG tablet Take 2 tablets (650 mg total) by mouth every 4 (four) hours as needed (for pain scale < 4). 01/13/18   Calvert CantorWeinhold, Samantha C, CNM  ibuprofen (ADVIL,MOTRIN) 800 MG tablet Take 1 tablet (800 mg total) by mouth every 8 (eight) hours as needed. 01/13/18   Calvert CantorWeinhold, Samantha C, CNM    Family History Family History  Problem Relation Age of Onset  . Asthma Mother   . Diabetes Mother   . Cancer Mother   . Cancer Father        lung  . Kidney disease Maternal Uncle   . Alcohol abuse Neg Hx   . Arthritis Neg Hx   . Birth defects Neg Hx   . COPD Neg Hx   . Depression Neg Hx   . Drug abuse Neg Hx   . Early death Neg Hx   . Hearing loss Neg Hx   . Heart disease Neg Hx   . Hyperlipidemia Neg Hx   . Hypertension Neg Hx   . Learning disabilities Neg Hx   . Mental  illness Neg Hx   . Mental retardation Neg Hx   . Vision loss Neg Hx   . Miscarriages / Stillbirths Neg Hx   . Stroke Neg Hx   . Varicose Veins Neg Hx     Social History Social History   Tobacco Use  . Smoking status: Current Every Day Smoker    Packs/day: 0.25    Types: Cigarettes    Last attempt to quit: 05/02/2014    Years since quitting: 4.3  . Smokeless tobacco: Never Used  Substance Use Topics  . Alcohol use: Yes    Comment: socially  . Drug use: Yes    Types: Marijuana    Comment: early in pregnancy     Allergies   Patient has no known allergies.   Review of Systems Review of Systems  Constitutional: Negative for fatigue and fever.  HENT: Negative for ear pain, sinus pain, sore throat and voice change.   Eyes: Negative for pain, redness and visual disturbance.  Respiratory: Negative for cough and shortness of breath.   Cardiovascular: Negative for chest pain and palpitations.   Gastrointestinal: Negative for abdominal pain, diarrhea and vomiting.  Musculoskeletal: Negative for arthralgias and myalgias.  Skin: Negative for rash and wound.  Neurological: Negative for syncope and headaches.  Psychiatric/Behavioral: Negative for self-injury, sleep disturbance and suicidal ideas. The patient is nervous/anxious.      Physical Exam Triage Vital Signs ED Triage Vitals  Enc Vitals Group     BP 08/30/18 1545 131/84     Pulse Rate 08/30/18 1545 93     Resp 08/30/18 1545 16     Temp 08/30/18 1545 98.8 F (37.1 C)     Temp Source 08/30/18 1545 Oral     SpO2 08/30/18 1545 98 %     Weight --      Height --      Head Circumference --      Peak Flow --      Pain Score 08/30/18 1537 0     Pain Loc --      Pain Edu? --      Excl. in GC? --    No data found.  Updated Vital Signs BP 131/84 (BP Location: Right Arm)   Pulse 93   Temp 98.8 F (37.1 C) (Oral)   Resp 16   LMP 08/13/2018   SpO2 98%   Visual Acuity Right Eye Distance:   Left Eye Distance:   Bilateral Distance:    Right Eye Near:   Left Eye Near:    Bilateral Near:     Physical Exam Constitutional:      General: She is not in acute distress. HENT:     Head: Normocephalic and atraumatic.     Jaw: There is normal jaw occlusion. No tenderness or pain on movement.     Right Ear: Hearing, tympanic membrane, ear canal and external ear normal. No tenderness. No mastoid tenderness.     Left Ear: Hearing, tympanic membrane, ear canal and external ear normal. No tenderness. No mastoid tenderness.     Nose: No nasal deformity, septal deviation or nasal tenderness.     Right Turbinates: Not swollen or pale.     Left Turbinates: Not swollen or pale.     Right Sinus: No maxillary sinus tenderness or frontal sinus tenderness.     Left Sinus: No maxillary sinus tenderness or frontal sinus tenderness.     Mouth/Throat:     Lips: Pink. No lesions.  Mouth: Mucous membranes are moist. No injury.      Pharynx: Oropharynx is clear. Uvula midline. No posterior oropharyngeal erythema or uvula swelling.     Tonsils: No tonsillar exudate. 2+ on the right. 2+ on the left.  Eyes:     General: No scleral icterus.    Pupils: Pupils are equal, round, and reactive to light.  Neck:     Musculoskeletal: Normal range of motion and neck supple. No muscular tenderness.  Cardiovascular:     Rate and Rhythm: Normal rate.  Pulmonary:     Effort: Pulmonary effort is normal.  Lymphadenopathy:     Cervical: No cervical adenopathy.  Skin:    Coloration: Skin is not jaundiced or pale.  Neurological:     Mental Status: She is alert and oriented to person, place, and time.      UC Treatments / Results  Labs (all labs ordered are listed, but only abnormal results are displayed) Labs Reviewed  NOVEL CORONAVIRUS, NAA    EKG   Radiology No results found.  Procedures Procedures (including critical care time)  Medications Ordered in UC Medications - No data to display  Initial Impression / Assessment and Plan / UC Course  I have reviewed the triage vital signs and the nursing notes.  Pertinent labs & imaging results that were available during my care of the patient were reviewed by me and considered in my medical decision making (see chart for details).     1.  Anxiety about health Second to exposure to COVID-19.  Cover test pending.  Given that patient was wearing appropriate PPE, as was coworker, did not have prolonged close contact, she has been afebrile and asymptomatic I feel it is appropriate that she is able to return to work with appropriate PPE as COVID infection is unlikely.  Patient to stay home from work if she develops symptoms, fever, or test positive for COVID.  Work note provided, return precautions discussed, patient verbalized understanding and is agreeable to plan. Final Clinical Impressions(s) / UC Diagnoses   Final diagnoses:  Anxiety about health     Discharge  Instructions     Your COVID test is pending: We will call you if your results are positive.   You may take OTC Tylenol for fever and myalgias.  It is important to drink plenty of water throughout the day to stay hydrated. If you test positive and later develop severe fever, cough, or shortness of breath, it is recommended that you go to the ER for further evaluation.    ED Prescriptions    None     Controlled Substance Prescriptions [place] Controlled Substance Registry consulted? Not Applicable   Quincy Sheehan, [place] 08/30/18 2026

## 2018-08-30 NOTE — Discharge Instructions (Addendum)
Your COVID test is pending: We will call you if your results are positive.   You may take OTC Tylenol for fever and myalgias.  It is important to drink plenty of water throughout the day to stay hydrated. If you test positive and later develop severe fever, cough, or shortness of breath, it is recommended that you go to the ER for further evaluation.

## 2018-08-30 NOTE — ED Notes (Signed)
Patient able to ambulate independently  

## 2018-08-30 NOTE — ED Triage Notes (Signed)
Patient presents to [facility] for assessment after a coworker at [facility] [employer] got diagnosed positive for COVID.  Patient states this may have been last week.  Patient states she sweats a lot, but states she is also always busy.  Denies cough, congestion, fever, body aches, chills, abdominal, n/v/d, rash, sore throat.

## 2018-08-31 LAB — NOVEL CORONAVIRUS, NAA: SARS-CoV-2, NAA: NOT DETECTED

## 2018-09-02 ENCOUNTER — Encounter (HOSPITAL_COMMUNITY): Payer: Self-pay

## 2020-02-02 IMAGING — US US FETAL BPP W/ NON-STRESS
1 series · 13 of 13 positions shown · non-contrast
Comparison: none

[Series 1: us fetal bpp w/nonstress · 13 acquisitions, 13 frames shown]
[im 1/13]
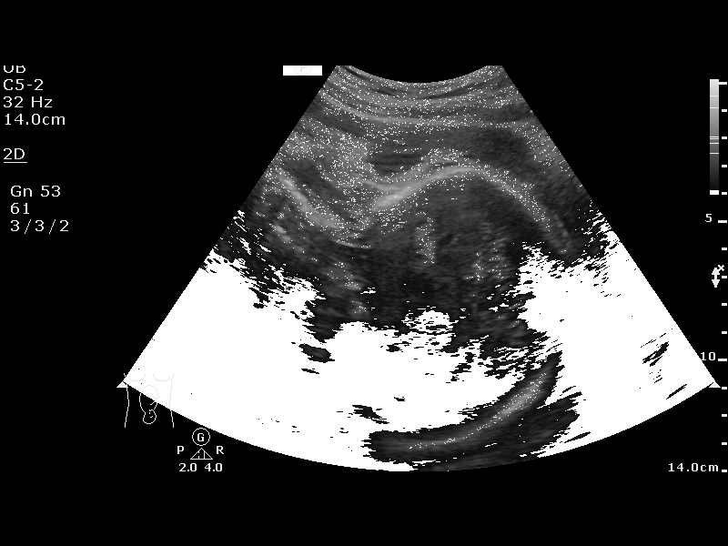
[im 2/13]
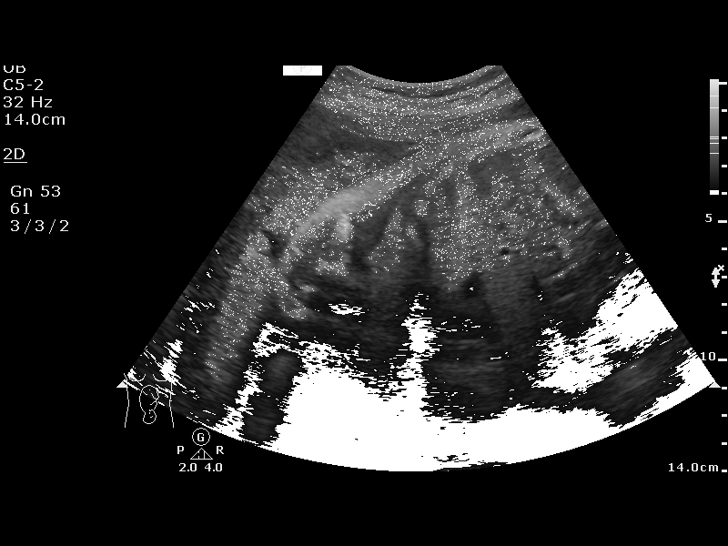
[im 3/13]
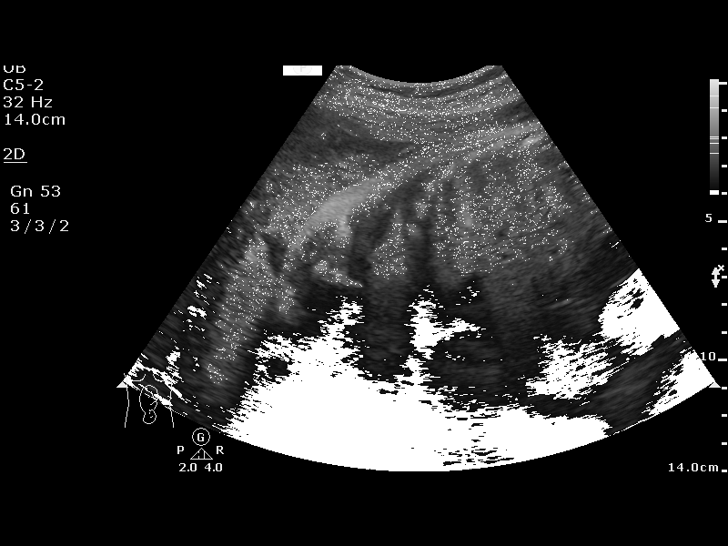
[im 4/13]
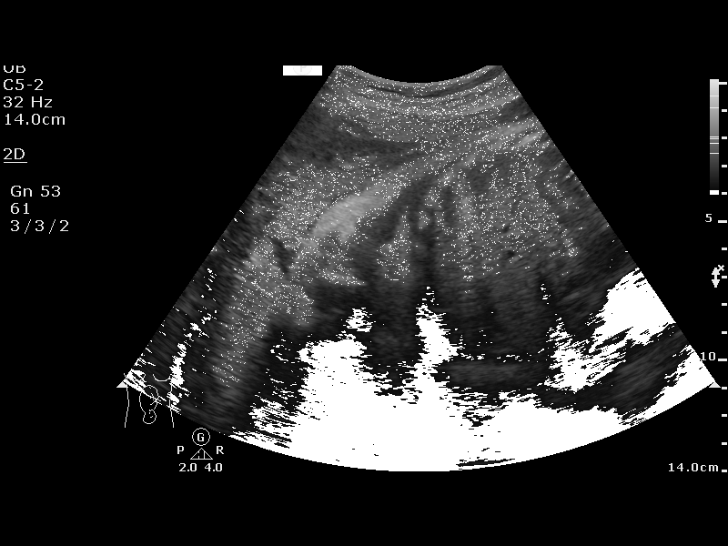
[im 5/13]
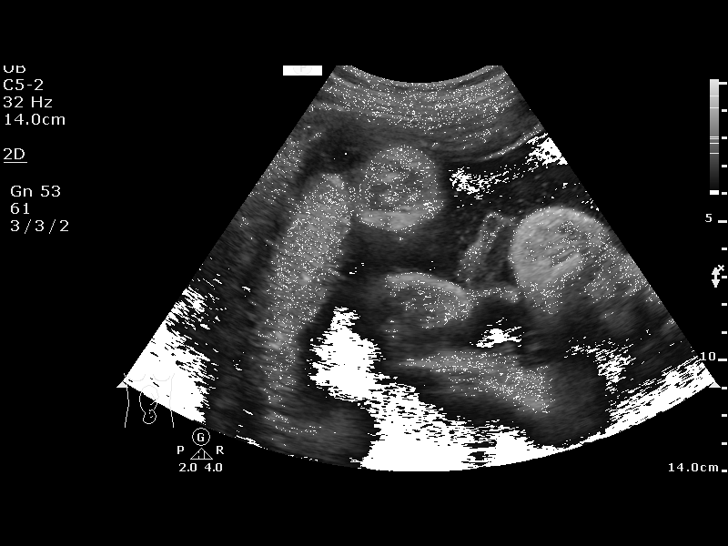
[im 6/13]
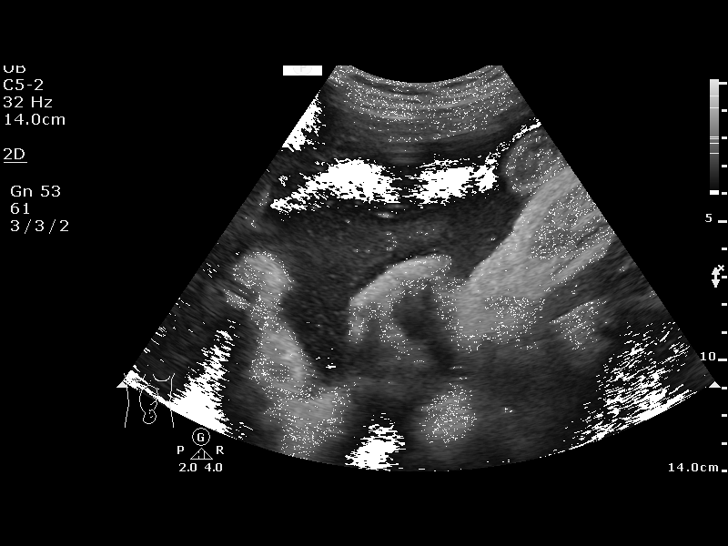
[im 7/13]
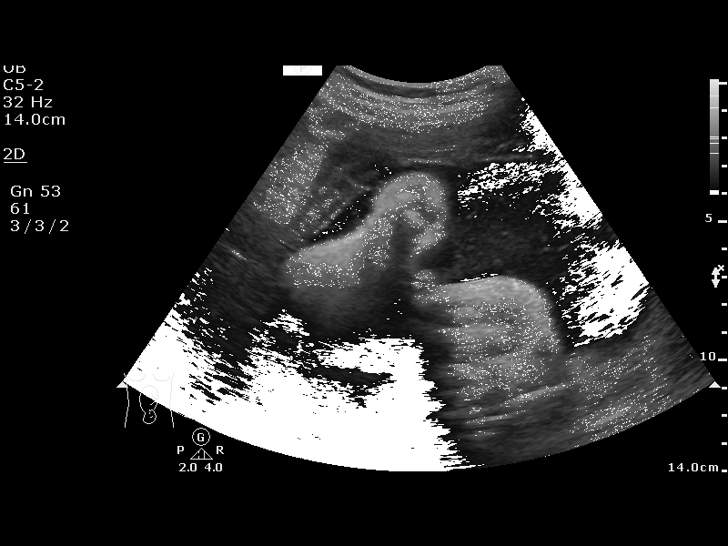
[im 8/13]
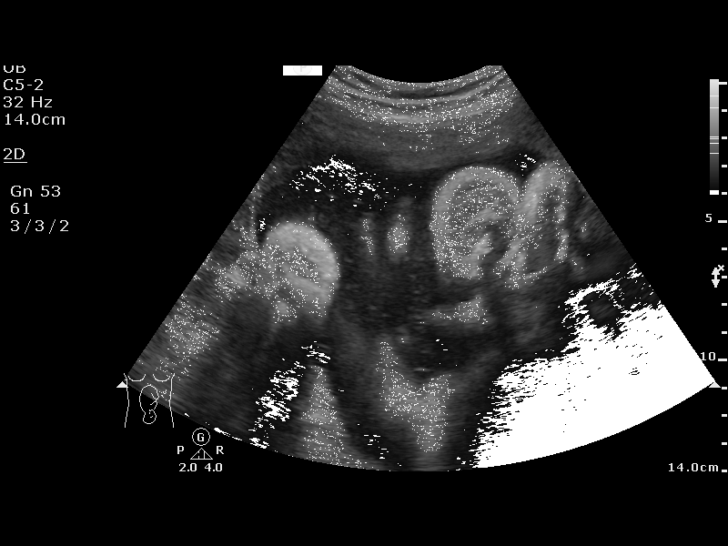
[im 9/13]
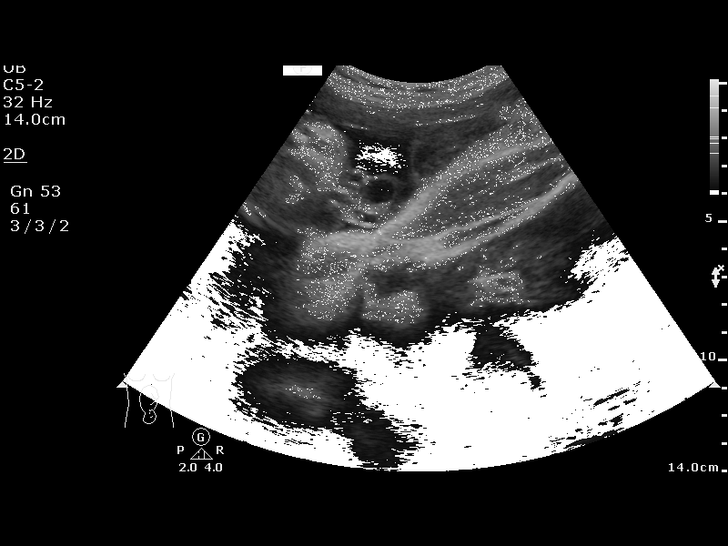
[im 10/13]
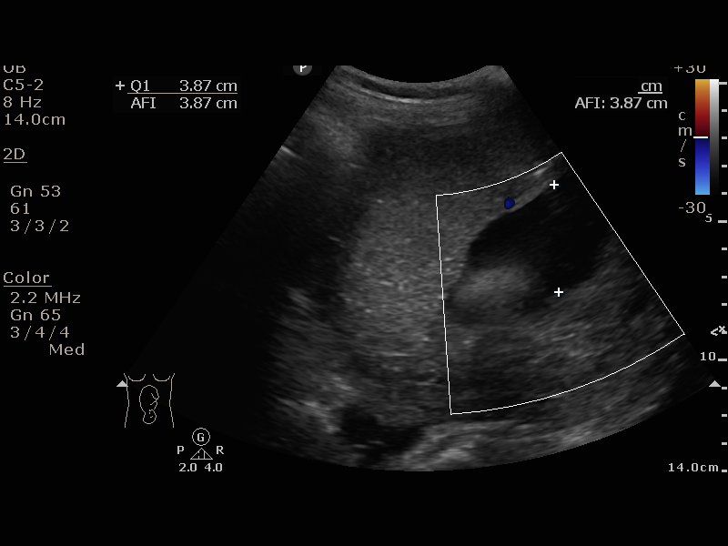
[im 11/13]
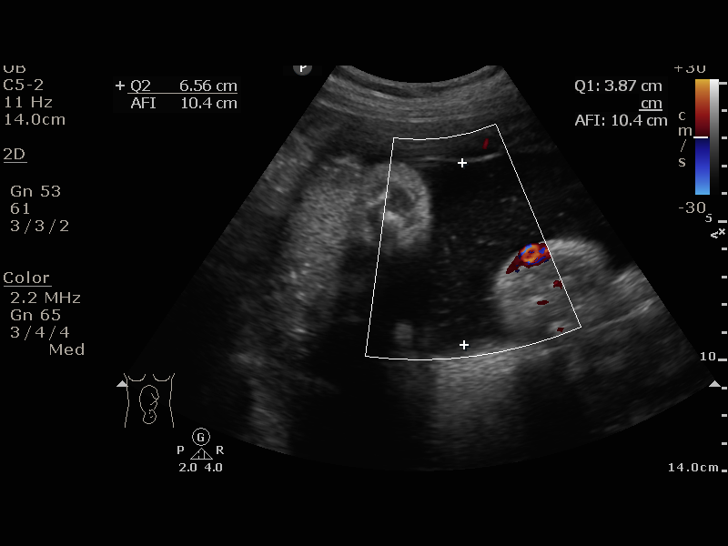
[im 12/13]
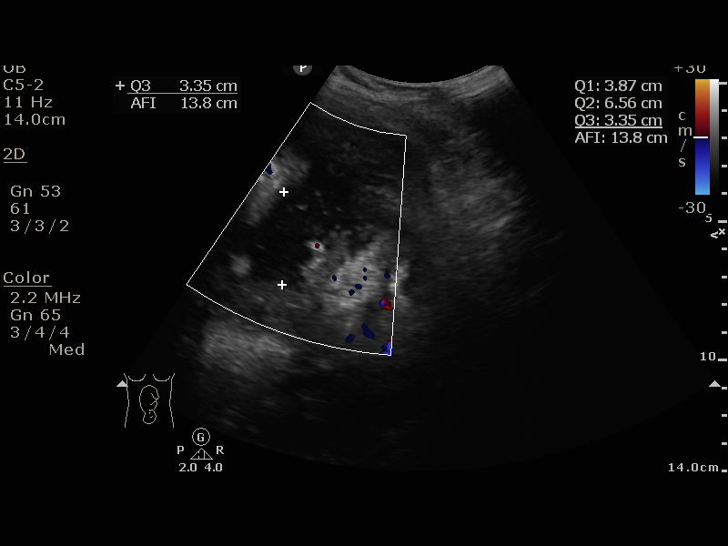
[im 13/13]
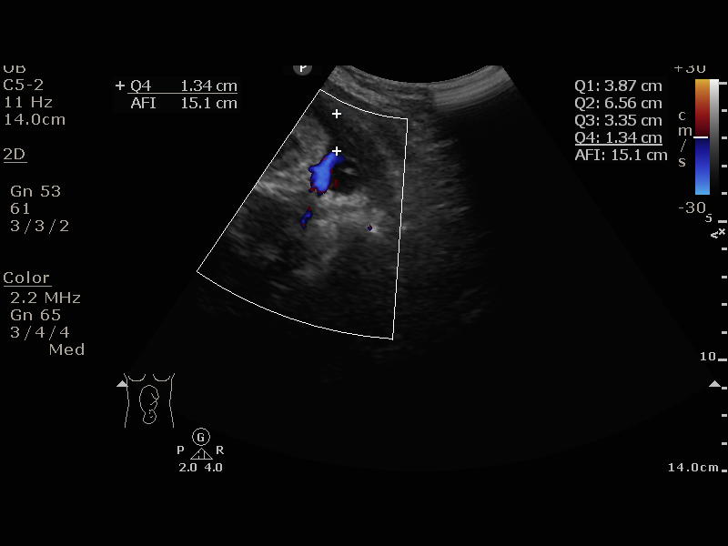

[13 of 13 positions shown; findings below may reference images not displayed]

BSN
                                                            [facility]
                                                            Women's
                                                            [REDACTED]

  1  US FETAL BPP W/NONSTRESS             76818.4      KIEFER MATTHEW
 ----------------------------------------------------------------------

 ----------------------------------------------------------------------
Indications

  37 weeks gestation of pregnancy
  Gestational diabetes in pregnancy, insulin
  controlled
 ----------------------------------------------------------------------
Fetal Evaluation

 Num Of Fetuses:         1
 Preg. Location:         Intrauterine
 Cardiac Activity:       Observed
 Fetal Lie:              Maternal right side
 Presentation:           Cephalic

 Amniotic Fluid
 AFI FV:      Within normal limits

 AFI Sum(cm)     %Tile       Largest Pocket(cm)
 15.12           57

 RUQ(cm)       RLQ(cm)       LUQ(cm)        LLQ(cm)

Biophysical Evaluation

 Amniotic F.V:   Pocket => 2 cm two         F. Tone:        Observed
                 planes
 F. Movement:    Observed                   N.S.T:          Reactive
 F. Breathing:   Observed                   Score:          [DATE]
OB History

 Gravidity:    4         Term:   2        Prem:   0        SAB:   1
 TOP:          0       Ectopic:  0        Living: 2
Gestational Age

 LMP:           40w 5d        Date:  03/19/17                 EDD:   12/24/17
 Best:          37w 1d     Det. By:  U/S  (08/02/17)          EDD:   01/18/18
Impression

 Viable female in cephalic presentation
Recommendations

 Continue weekly antenatal testing till delivery.
                Dido, Acp

## 2020-06-25 ENCOUNTER — Other Ambulatory Visit: Payer: Self-pay

## 2020-06-25 ENCOUNTER — Ambulatory Visit (HOSPITAL_COMMUNITY)
Admission: EM | Admit: 2020-06-25 | Discharge: 2020-06-25 | Discharge status: Against Medical Advice | Payer: Medicaid Other

## 2020-06-25 NOTE — ED Triage Notes (Signed)
Called pt 3 x in lobby no response.

## 2020-06-25 NOTE — ED Notes (Signed)
Called pt in lobby no response
